# Patient Record
Sex: Female | Born: 1985 | Race: Black or African American | Hispanic: No | Marital: Single | Smoking: Never smoker
Health system: Southern US, Community
[De-identification: ages and names within clinical notes are randomized; demographics above are authoritative.]

## PROBLEM LIST (undated history)

## (undated) ENCOUNTER — Inpatient Hospital Stay (HOSPITAL_COMMUNITY): Payer: Self-pay

## (undated) ENCOUNTER — Ambulatory Visit (HOSPITAL_COMMUNITY): Admission: EM | Payer: Commercial Managed Care - PPO | Source: Home / Self Care

## (undated) DIAGNOSIS — I10 Essential (primary) hypertension: Secondary | ICD-10-CM

## (undated) DIAGNOSIS — R87629 Unspecified abnormal cytological findings in specimens from vagina: Secondary | ICD-10-CM

## (undated) DIAGNOSIS — E119 Type 2 diabetes mellitus without complications: Secondary | ICD-10-CM

## (undated) HISTORY — DX: Essential (primary) hypertension: I10

## (undated) HISTORY — PX: CHOLECYSTECTOMY: SHX55

---

## 2014-09-22 ENCOUNTER — Encounter (HOSPITAL_COMMUNITY): Payer: Self-pay | Admitting: *Deleted

## 2014-09-22 ENCOUNTER — Inpatient Hospital Stay (HOSPITAL_COMMUNITY)
Admission: AD | Admit: 2014-09-22 | Discharge: 2014-09-22 | Disposition: A | Discharge status: Home / Self Care | Payer: No Typology Code available for payment source | Source: Ambulatory Visit | Attending: Family Medicine | Admitting: Family Medicine

## 2014-09-22 DIAGNOSIS — R102 Pelvic and perineal pain: Secondary | ICD-10-CM | POA: Insufficient documentation

## 2014-09-22 DIAGNOSIS — N898 Other specified noninflammatory disorders of vagina: Secondary | ICD-10-CM

## 2014-09-22 HISTORY — DX: Unspecified abnormal cytological findings in specimens from vagina: R87.629

## 2014-09-22 LAB — URINALYSIS, ROUTINE W REFLEX MICROSCOPIC
BILIRUBIN URINE: NEGATIVE
GLUCOSE, UA: NEGATIVE mg/dL
Ketones, ur: NEGATIVE mg/dL
Leukocytes, UA: NEGATIVE
Nitrite: NEGATIVE
PROTEIN: NEGATIVE mg/dL
SPECIFIC GRAVITY, URINE: 1.02 (ref 1.005–1.030)
Urobilinogen, UA: 0.2 mg/dL (ref 0.0–1.0)
pH: 6 (ref 5.0–8.0)

## 2014-09-22 LAB — WET PREP, GENITAL
Clue Cells Wet Prep HPF POC: NONE SEEN
Trich, Wet Prep: NONE SEEN
YEAST WET PREP: NONE SEEN

## 2014-09-22 LAB — POCT PREGNANCY, URINE: Preg Test, Ur: NEGATIVE

## 2014-09-22 LAB — URINE MICROSCOPIC-ADD ON

## 2014-09-22 NOTE — MAU Note (Signed)
Pelvic pain that radiates to her back for the past 2 years.  Pain started 1 1/2 weeks ago with this episode, pt started her period today.

## 2014-09-22 NOTE — Discharge Instructions (Signed)
Exercise Safely Exercise does not have to hurt to be beneficial. Start easy and build up your effort. Exercise at times of the day when you feel your best. Do not hold your breath; remember to breathe out while working and breathe in while relaxing. May also count out loud while exercising. Exercise should not cause joint pain, but rather muscle fatigue.  Copyright  VHI. All rights reserved.

## 2014-09-22 NOTE — MAU Provider Note (Signed)
  History     CSN: [id number]  Arrival date and time: 09/22/14 1724   First Provider Initiated Contact with Patient 09/22/14 1806      Chief Complaint  Patient presents with  . Pelvic Pain   HPI Meredith Harrington 29 y.o. G0P0000 nonpregnant female presents to MAU complaining of vaginal discharge and pelvic pain.  She reports this has happened off and on for 2+ years.  She does not trust the medical system and has not sought eval for this in some time.  However, she did go one time and was found to have BV.  Treatment resulted in resolution of symptoms.   She presently denies nausea, vomiting, fever, weakness, vaginal bleeding, dysuria.    OB History    Gravida Para Term Preterm AB TAB SAB Ectopic Multiple Living   0 0 0 0 0 0 0 0 0 0       Past Medical History  Diagnosis Date  . Vaginal Pap smear, abnormal     Past Surgical History  Procedure Laterality Date  . Cholecystectomy      History reviewed. No pertinent family history.  History  Substance Use Topics  . Smoking status: Never Smoker   . Smokeless tobacco: Not on file  . Alcohol Use: Yes     Comment: ocasional on holidays    Allergies: No Known Allergies  Prescriptions prior to admission  Medication Sig Dispense Refill Last Dose  . Multiple Vitamins-Minerals (MULTIVITAMIN & MINERAL) LIQD Take 15 mLs by mouth daily.   Past Month at Unknown time    ROS Pertinent ROS in HPI.  All other systems are negative.   Physical Exam   Blood pressure 144/88, pulse 96, temperature 98.6 F (37 C), temperature source Oral, resp. rate 18, height 5\' 6"  (1.676 m), weight 238 lb (107.956 kg), last menstrual period 09/22/2014.  Physical Exam  Constitutional: She is oriented to person, place, and time. She appears well-developed and well-nourished. No distress.  HENT:  Head: Normocephalic and atraumatic.  Eyes: EOM are normal.  Neck: Normal range of motion.  Cardiovascular: Normal rate and regular rhythm.   Respiratory:  Effort normal. No respiratory distress.  GI: Soft. She exhibits no distension. There is no tenderness.  Genitourinary:  Small amt of vaginal discharge No CMT, no adnexal mass or tenderness  Musculoskeletal: Normal range of motion.  Neurological: She is alert and oriented to person, place, and time.  Skin: Skin is warm and dry.  Psychiatric: She has a normal mood and affect.    MAU Course  Procedures  MDM STD testing, CBC ordered.  Pt declines blood draw.   Wet prep is noncontributory Pt declines further eval.    Assessment and Plan  A: vaginal discharge  P: Discharge to home Awaiting GC/Chlam F/u with PCP vs Health Department for further eval  Bertram Denvereague Clark, Karen E 09/22/2014, 7:29 PM

## 2014-09-26 LAB — GC/CHLAMYDIA PROBE AMP (~~LOC~~) NOT AT ARMC
Chlamydia: NEGATIVE
Neisseria Gonorrhea: NEGATIVE

## 2015-10-08 ENCOUNTER — Encounter (HOSPITAL_COMMUNITY): Payer: Self-pay

## 2015-10-08 ENCOUNTER — Emergency Department (HOSPITAL_COMMUNITY)
Admission: EM | Admit: 2015-10-08 | Discharge: 2015-10-08 | Disposition: A | Discharge status: Home / Self Care | Payer: No Typology Code available for payment source | Attending: Emergency Medicine | Admitting: Emergency Medicine

## 2015-10-08 DIAGNOSIS — E119 Type 2 diabetes mellitus without complications: Secondary | ICD-10-CM | POA: Diagnosis not present

## 2015-10-08 DIAGNOSIS — Z7984 Long term (current) use of oral hypoglycemic drugs: Secondary | ICD-10-CM | POA: Insufficient documentation

## 2015-10-08 DIAGNOSIS — R42 Dizziness and giddiness: Secondary | ICD-10-CM | POA: Diagnosis present

## 2015-10-08 DIAGNOSIS — R0981 Nasal congestion: Secondary | ICD-10-CM

## 2015-10-08 DIAGNOSIS — R519 Headache, unspecified: Secondary | ICD-10-CM

## 2015-10-08 DIAGNOSIS — R51 Headache: Secondary | ICD-10-CM

## 2015-10-08 LAB — BASIC METABOLIC PANEL
Anion gap: 6 (ref 5–15)
CALCIUM: 9 mg/dL (ref 8.9–10.3)
CO2: 26 mmol/L (ref 22–32)
Chloride: 104 mmol/L (ref 101–111)
Creatinine, Ser: 0.77 mg/dL (ref 0.44–1.00)
GFR calc Af Amer: >60 mL/min (ref >60)
GFR calc non Af Amer: >60 mL/min (ref >60)
Glucose, Bld: 152 mg/dL — ABNORMAL HIGH (ref 65–99)
Potassium: 4.1 mmol/L (ref 3.5–5.1)
Sodium: 136 mmol/L (ref 135–145)

## 2015-10-08 LAB — CBC
HCT: 34.7 % — ABNORMAL LOW (ref 36.0–46.0)
Hemoglobin: 12 g/dL (ref 12.0–15.0)
MCH: 26.7 pg (ref 26.0–34.0)
MCHC: 34.6 g/dL (ref 30.0–36.0)
MCV: 77.1 fL — ABNORMAL LOW (ref 78.0–100.0)
Platelets: 273 10*3/uL (ref 150–400)
RBC: 4.5 MIL/uL (ref 3.87–5.11)
RDW: 14.6 % (ref 11.5–15.5)
WBC: 8.3 10*3/uL (ref 4.0–10.5)

## 2015-10-08 LAB — URINE MICROSCOPIC-ADD ON: Bacteria, UA: NONE SEEN

## 2015-10-08 LAB — CBG MONITORING, ED
GLUCOSE-CAPILLARY: 151 mg/dL — AB (ref 65–99)
Glucose-Capillary: 143 mg/dL — ABNORMAL HIGH (ref 65–99)

## 2015-10-08 LAB — URINALYSIS, ROUTINE W REFLEX MICROSCOPIC
BILIRUBIN URINE: NEGATIVE
Glucose, UA: NEGATIVE mg/dL
HGB URINE DIPSTICK: NEGATIVE
Ketones, ur: NEGATIVE mg/dL
Nitrite: NEGATIVE
Protein, ur: NEGATIVE mg/dL
Specific Gravity, Urine: 1.022 (ref 1.005–1.030)
pH: 6 (ref 5.0–8.0)

## 2015-10-08 LAB — POC URINE PREG, ED: Preg Test, Ur: NEGATIVE

## 2015-10-08 MED ORDER — IBUPROFEN 200 MG PO TABS
600.0000 mg | ORAL_TABLET | Freq: Once | ORAL | Status: AC
  Administered 2015-10-08: 600 mg via ORAL
  Filled 2015-10-08: qty 3

## 2015-10-08 MED ORDER — MECLIZINE HCL 25 MG PO TABS: 25.0000 mg | ORAL_TABLET | Freq: Once | ORAL | Status: DC

## 2015-10-08 MED ORDER — METFORMIN HCL 500 MG PO TABS: 500.0000 mg | ORAL_TABLET | Freq: Two times a day (BID) | ORAL | Status: DC

## 2015-10-08 MED ORDER — MECLIZINE HCL 25 MG PO TABS
25.0000 mg | ORAL_TABLET | Freq: Once | ORAL | Status: AC
  Administered 2015-10-08: 25 mg via ORAL
  Filled 2015-10-08: qty 1

## 2015-10-08 MED ORDER — ONDANSETRON HCL 4 MG PO TABS: 4.0000 mg | ORAL_TABLET | Freq: Four times a day (QID) | ORAL | Status: DC

## 2015-10-08 NOTE — Discharge Instructions (Signed)
Take the Antivert as prescribed. Follow-up with your primary care provider within 2 days to be reevaluated for you dizziness.   Take Flonase and Zyrtec once daily.  Return to the emergency department if you experience worsening dizziness, you pass out, visual changes, nausea, vomiting, numbnes/tingling or weakness.  Allergies An allergy is an abnormal reaction to a substance by the body's defense system (immune system). Allergies can develop at any age. WHAT CAUSES ALLERGIES? An allergic reaction happens when the immune system mistakenly reacts to a normally harmless substance, called an allergen, as if it were harmful. The immune system releases antibodies to fight the substance. Antibodies eventually release a chemical called histamine into the bloodstream. The release of histamine is meant to protect the body from infection, but it also causes discomfort. An allergic reaction can be triggered by:  Eating an allergen.  Inhaling an allergen.  Touching an allergen. WHAT TYPES OF ALLERGIES ARE THERE? There are many types of allergies. Common types include:  Seasonal allergies. People with this type of allergy are usually allergic to substances that are only present during certain seasons, such as molds and pollens.  Food allergies.  Drug allergies.  Insect allergies.  Animal dander allergies. WHAT ARE SYMPTOMS OF ALLERGIES? Possible allergy symptoms include:  Swelling of the lips, face, tongue, mouth, or throat.  Sneezing, coughing, or wheezing.  Nasal congestion.  Tingling in the mouth.  Rash.  Itching.  Itchy, red, swollen areas of skin (hives).  Watery eyes.  Vomiting.  Diarrhea.  Dizziness.  Lightheadedness.  Fainting.  Trouble breathing or swallowing.  Chest tightness.  Rapid heartbeat. HOW ARE ALLERGIES DIAGNOSED? Allergies are diagnosed with a medical and family history and one or more of the following:  Skin tests.  Blood tests.  A food  diary. A food diary is a record of all the foods and drinks you have in a day and of all the symptoms you experience.  The results of an elimination diet. An elimination diet involves eliminating foods from your diet and then adding them back in one by one to find out if a certain food causes an allergic reaction. HOW ARE ALLERGIES TREATED? There is no cure for allergies, but allergic reactions can be treated with medicine. Severe reactions usually need to be treated at a hospital. HOW CAN REACTIONS BE PREVENTED? The best way to prevent an allergic reaction is by avoiding the substance you are allergic to. Allergy shots and medicines can also help prevent reactions in some cases. People with severe allergic reactions may be able to prevent a life-threatening reaction called anaphylaxis with a medicine given right after exposure to the allergen.   This information is not intended to replace advice given to you by your health care provider. Make sure you discuss any questions you have with your health care provider.   Document Released: 07/08/2002 Document Revised: 05/05/2014 Document Reviewed: 01/24/2014 Elsevier Interactive Patient Education 2016 Else[employer] Inc.  Benign Positional Vertigo Vertigo is the feeling that you or your surroundings are moving when they are not. Benign positional vertigo is the most common form of vertigo. The cause of this condition is not serious (is benign). This condition is triggered by certain movements and positions (is positional). This condition can be dangerous if it occurs while you are doing something that could endanger you or others, such as driving.  CAUSES In many cases, the cause of this condition is not known. It may be caused by a disturbance in an area of the  inner ear that helps your brain to sense movement and balance. This disturbance can be caused by a viral infection (labyrinthitis), head injury, or repetitive motion. RISK FACTORS This condition is  more likely to develop in:  Women.  People who are 48 years of age or older. SYMPTOMS Symptoms of this condition usually happen when you move your head or your eyes in different directions. Symptoms may start suddenly, and they usually last for less than a minute. Symptoms may include:  Loss of balance and falling.  Feeling like you are spinning or moving.  Feeling like your surroundings are spinning or moving.  Nausea and vomiting.  Blurred vision.  Dizziness.  Involuntary eye movement (nystagmus). Symptoms can be mild and cause only slight annoyance, or they can be severe and interfere with daily life. Episodes of benign positional vertigo may return (recur) over time, and they may be triggered by certain movements. Symptoms may improve over time. DIAGNOSIS This condition is usually diagnosed by medical history and a physical exam of the head, neck, and ears. You may be referred to a health care provider who specializes in ear, nose, and throat (ENT) problems (otolaryngologist) or a provider who specializes in disorders of the nervous system (neurologist). You may have additional testing, including:  MRI.  A CT scan.  Eye movement tests. Your health care provider may ask you to change positions quickly while he or she watches you for symptoms of benign positional vertigo, such as nystagmus. Eye movement may be tested with an electronystagmogram (ENG), caloric stimulation, the Dix-Hallpike test, or the roll test.  An electroencephalogram (EEG). This records electrical activity in your brain.  Hearing tests. TREATMENT Usually, your health care provider will treat this by moving your head in specific positions to adjust your inner ear back to normal. Surgery may be needed in severe cases, but this is rare. In some cases, benign positional vertigo may resolve on its own in 2-4 weeks. HOME CARE INSTRUCTIONS Safety  Move slowly.Avoid sudden body or head movements.  Avoid  driving.  Avoid operating heavy machinery.  Avoid doing any tasks that would be dangerous to you or others if a vertigo episode would occur.  If you have trouble walking or keeping your balance, try using a cane for stability. If you feel dizzy or unstable, sit down right away.  Return to your normal activities as told by your health care provider. Ask your health care provider what activities are safe for you. General Instructions  Take over-the-counter and prescription medicines only as told by your health care provider.  Avoid certain positions or movements as told by your health care provider.  Drink enough fluid to keep your urine clear or pale yellow.  Keep all follow-up visits as told by your health care provider. This is important. SEEK MEDICAL CARE IF:  You have a fever.  Your condition gets worse or you develop new symptoms.  Your family or friends notice any behavioral changes.  Your nausea or vomiting gets worse.  You have numbness or a "pins and needles" sensation. SEEK IMMEDIATE MEDICAL CARE IF:  You have difficulty speaking or moving.  You are always dizzy.  You faint.  You develop severe headaches.  You have weakness in your legs or arms.  You have changes in your hearing or vision.  You develop a stiff neck.  You develop sensitivity to light.   This information is not intended to replace advice given to you by your health care provider. Make  sure you discuss any questions you have with your health care provider.   Document Released: 01/20/2006 Document Revised: 01/03/2015 Document Reviewed: 08/07/2014 Elsevier Interactive Patient Education [employer]2016 Else[employer] Inc.

## 2015-10-08 NOTE — ED Notes (Signed)
Pt is ambulatory and reassessed

## 2015-10-08 NOTE — ED Notes (Addendum)
Pt CBG, 151. 

## 2015-10-08 NOTE — ED Provider Notes (Signed)
CSN: [id number]     Arrival date & time 10/08/15  0945 History   First MD Initiated Contact with Patient 10/08/15 1551     Chief Complaint  Patient presents with  . Dizziness     (Consider location/radiation/quality/duration/timing/severity/associated sxs/prior Treatment) HPI   Patient is a 30 year old female with a history of untreated DM type II who presents the ED with 3 months of intermittent dizziness. She states the dizziness occurs while driving her car, while moving at work, and rising from a seated position or from lying. She states the dizziness is not a sensation of the room spinning but as if her head is disconnected. She states associated sinus congestion, sinus headache, neck tightness, post nasal drainage, ear pressure. She has not taken anything for the dizziness. She denies syncope, chest pain, shortness of breath, numbness/tingling, weakness, fever, chills, visual changes.  Past Medical History  Diagnosis Date  . Vaginal Pap smear, abnormal    Past Surgical History  Procedure Laterality Date  . Cholecystectomy     No family history on file. Social History  Substance Use Topics  . Smoking status: Never Smoker   . Smokeless tobacco: None  . Alcohol Use: Yes     Comment: ocasional on holidays   OB History    Gravida Para Term Preterm AB TAB SAB Ectopic Multiple Living   0 0 0 0 0 0 0 0 0 0      Review of Systems  Constitutional: Negative for fever and chills.  HENT: Positive for congestion, ear pain, postnasal drip and sinus pressure. Negative for ear discharge, sore throat and tinnitus.   Eyes: Negative for visual disturbance.  Respiratory: Negative for shortness of breath.   Cardiovascular: Negative for chest pain.  Gastrointestinal: Negative for nausea, vomiting and abdominal pain.  Musculoskeletal: Positive for neck stiffness. Negative for back pain and neck pain.  Skin: Negative for rash.  Neurological: Positive for dizziness and headaches. Negative for  syncope, weakness and numbness.      Allergies  Shellfish allergy  Home Medications   Prior to Admission medications   Medication Sig Start Date End Date Taking? Authorizing Provider  meclizine (ANTIVERT) 25 MG tablet Take 1 tablet (25 mg total) by mouth once. 10/08/15   Jerre Simon, PA  metFORMIN (GLUCOPHAGE) 500 MG tablet Take 1 tablet (500 mg total) by mouth 2 (two) times daily with a meal. 10/08/15   Jerre Simon, PA  Multiple Vitamins-Minerals (MULTIVITAMIN & MINERAL) LIQD Take 15 mLs by mouth daily.    Historical Provider, MD  ondansetron (ZOFRAN) 4 MG tablet Take 1 tablet (4 mg total) by mouth every 6 (six) hours. 10/08/15   Paiton Fosco L Midge Momon, PA   BP 140/88 mmHg  Pulse 64  Temp(Src) 98.2 F (36.8 C) (Oral)  Resp 18  Ht 5\' 6"  (1.676 m)  Wt 117.935 kg  BMI 41.99 kg/m2  SpO2 100% Physical Exam   Constitutional: Pt is oriented to person, place, and time. Pt appears well-developed and well-nourished. No distress.  HENT:  Head: Normocephalic and atraumatic.  Mouth/Throat: Oropharynx is clear and moist.  Eyes: Conjunctivae and EOM are normal. Pupils are equal, round, and reactive to light. No scleral icterus.  Ears: Tm's normal, fluid appreciated behind TM's, no erythema or exudate apprecitated No horizontal, vertical or rotational nystagmus, dizziness reproduced with right and left gaze   Neck: Normal range of motion. Neck supple.  Full active and passive ROM without pain No nuchal rigidity or meningeal signs  Cardiovascular:  Normal rate, regular rhythm and intact distal pulses, DP 2+ bilaterally.   Pulmonary/Chest: Effort normal  No respiratory distress, CTA, no rhonchi, wheezes or rales.  Musculoskeletal: Normal range of motion.  Neurological: Pt. is alert and oriented to person, place, and time. No cranial nerve deficit.  Exhibits normal muscle tone. Coordination normal.  Mental Status:  Alert, oriented, thought content appropriate. Speech fluent without evidence of  aphasia. Able to follow 2 step commands without difficulty.  Cranial Nerves:  II:  Peripheral visual fields grossly normal, pupils equal, round, reactive to light III,IV, VI: ptosis not present, extra-ocular motions intact bilaterally  V,VII: smile symmetric, facial light touch sensation equal VIII: hearing grossly normal bilaterally  IX,X: midline uvula rise  XI: bilateral shoulder shrug equal and strong XII: midline tongue extension  Motor:  5/5 in upper and lower extremities bilaterally including strong and equal grip strength and dorsiflexion/plantar flexion Sensory: light touch normal in all extremities.  Cerebellar: normal finger-to-nose with bilateral upper extremities, pronator drift negative Gait: normal gait and balance Skin: Skin is warm and dry. No rash noted. Pt is not diaphoretic.  Psychiatric: Pt has a normal mood and affect. Behavior is normal. Judgment and thought content normal.  Nursing note and vitals reviewed.   ED Course  Procedures (including critical care time) Labs Review Labs Reviewed  BASIC METABOLIC PANEL - Abnormal; Notable for the following:    Glucose, Bld 152 (*)    BUN <5 (*)    All other components within normal limits  CBC - Abnormal; Notable for the following:    HCT 34.7 (*)    MCV 77.1 (*)    All other components within normal limits  URINALYSIS, ROUTINE W REFLEX MICROSCOPIC (NOT [facility]) - Abnormal; Notable for the following:    APPearance HAZY (*)    Leukocytes, UA SMALL (*)    All other components within normal limits  URINE MICROSCOPIC-ADD ON - Abnormal; Notable for the following:    Squamous Epithelial / LPF 6-30 (*)    All other components within normal limits  CBG MONITORING, ED - Abnormal; Notable for the following:    Glucose-Capillary 151 (*)    All other components within normal limits  CBG MONITORING, ED - Abnormal; Notable for the following:    Glucose-Capillary 143 (*)    All other components within normal limits  POC  URINE PREG, ED    Imaging Review No results found. I have personally reviewed and evaluated these images and lab results as part of my medical decision-making.   EKG Interpretation   Date/Time:  Monday October 08 2015 10:45:00 EDT Ventricular Rate:  65 PR Interval:  148 QRS Duration: 82 QT Interval:  376 QTC Calculation: 391 R Axis:   50 Text Interpretation:  Normal sinus rhythm Normal ECG Confirmed by Lincoln Brighamees,  Liz [phone]([id number]) on 10/08/2015 3:42:39 PM      MDM   Final diagnoses:  Dizziness  Nonintractable headache, unspecified chronicity pattern, unspecified headache type  Sinus congestion   Afebrile well-appearing patient, VSS. Labs unremarkable. Patient with intermittent dizziness for roughly 3 months. Patient states history of sinus congestion and she feels the dizziness came on with the onset of this sinus congestion. Patient has taken a nasal decongestant spray without relief. Patient was no neurological deficits on exam. Her dizziness reproduced with gaze to the right and gaze to the left during EOM exam. No nystagmus appreciated. I feel that her dizziness is likely secondary to sinus congestion and increased inner ear  pressure. Instructed patient to take Flonase and Zyrtec. Patient was given an Antivert while in the ED and her dizziness improved. Less likely this is intracranial etiology and more likely benign positional vertigo. Patient not orthostatic in the ED. EKG revealed normal sinus rhythm.  Patient's blood sugar was elevated while in the ED. She states she was given metformin months ago but did not take it because it made her nauseous. I gave her prescription for metformin and Zofran and instructed her to take her metformin and follow up with her PCP. Also patient's blood pressure was slightly elevated. I instructed her to follow-up with her PCP regarding this as well.  I gave patient a prescription for Antivert at time of discharge. I discussed strict return precautions. I  instructed the patient to follow up with her PCP regarding these issues. She expressed understanding to the discharge instructions.    Jerre Simon, PA 10/09/15 1610  Tilden Fossa, MD 10/09/15 1547

## 2015-10-08 NOTE — ED Notes (Signed)
Patient comes to the ER today complaining of dizzy spells for last 3 months. SHe comes in today because she had a dizzy spell and work sent her here. She was driven by co-worker.

## 2015-10-13 ENCOUNTER — Encounter (HOSPITAL_COMMUNITY): Payer: Self-pay

## 2015-10-13 ENCOUNTER — Emergency Department (HOSPITAL_COMMUNITY)
Admission: EM | Admit: 2015-10-13 | Discharge: 2015-10-13 | Disposition: A | Discharge status: Home / Self Care | Payer: No Typology Code available for payment source | Attending: Emergency Medicine | Admitting: Emergency Medicine

## 2015-10-13 DIAGNOSIS — G44209 Tension-type headache, unspecified, not intractable: Secondary | ICD-10-CM

## 2015-10-13 DIAGNOSIS — M542 Cervicalgia: Secondary | ICD-10-CM | POA: Diagnosis present

## 2015-10-13 MED ORDER — DIPHENHYDRAMINE HCL 25 MG PO CAPS
50.0000 mg | ORAL_CAPSULE | Freq: Once | ORAL | Status: DC
  Filled 2015-10-13: qty 2

## 2015-10-13 MED ORDER — METOCLOPRAMIDE HCL 10 MG PO TABS
10.0000 mg | ORAL_TABLET | Freq: Once | ORAL | Status: AC
  Administered 2015-10-13: 10 mg via ORAL
  Filled 2015-10-13: qty 1

## 2015-10-13 MED ORDER — DIPHENHYDRAMINE HCL 25 MG PO CAPS
25.0000 mg | ORAL_CAPSULE | Freq: Once | ORAL | Status: AC
  Administered 2015-10-13: 25 mg via ORAL

## 2015-10-13 MED ORDER — KETOROLAC TROMETHAMINE 30 MG/ML IJ SOLN
30.0000 mg | Freq: Once | INTRAMUSCULAR | Status: AC
  Administered 2015-10-13: 30 mg via INTRAMUSCULAR
  Filled 2015-10-13: qty 1

## 2015-10-13 NOTE — Discharge Instructions (Signed)
Please follow-up with your doctor or the [facility] for further evaluation and management of your symptoms as well as management of your blood pressure. Return to ED for new or worsening symptoms as we discussed.  Tension Headache A tension headache is a feeling of pain, pressure, or aching that is often felt over the front and sides of the head. The pain can be dull, or it can feel tight (constricting). Tension headaches are not normally associated with nausea or vomiting, and they do not get worse with physical activity. Tension headaches can last from 30 minutes to several days. This is the most common type of headache. CAUSES The exact cause of this condition is not known. Tension headaches often begin after stress, anxiety, or depression. Other triggers may include:  Alcohol.  Too much caffeine, or caffeine withdrawal.  Respiratory infections, such as colds, flu, or sinus infections.  Dental problems or teeth clenching.  Fatigue.  Holding your head and neck in the same position for a long period of time, such as while using a computer.  Smoking. SYMPTOMS Symptoms of this condition include:  A feeling of pressure around the head.  Dull, aching head pain.  Pain felt over the front and sides of the head.  Tenderness in the muscles of the head, neck, and shoulders. DIAGNOSIS This condition may be diagnosed based on your symptoms and a physical exam. Tests may be done, such as a CT scan or an MRI of your head. These tests may be done if your symptoms are severe or unusual. TREATMENT This condition may be treated with lifestyle changes and medicines to help relieve symptoms. HOME CARE INSTRUCTIONS Managing Pain  Take over-the-counter and prescription medicines only as told by your health care provider.  Lie down in a dark, quiet room when you have a headache.  If directed, apply ice to the head and neck area:  Put ice in a plastic bag.  Place a  towel between your skin and the bag.  Leave the ice on for 20 minutes, 2-3 times per day.  Use a heating pad or a hot shower to apply heat to the head and neck area as told by your health care provider. Eating and Drinking  Eat meals on a regular schedule.  Limit alcohol use.  Decrease your caffeine intake, or stop using caffeine. General Instructions  Keep all follow-up visits as told by your health care provider. This is important.  Keep a headache journal to help find out what may trigger your headaches. For example, write down:  What you eat and drink.  How much sleep you get.  Any change to your diet or medicines.  Try massage or other relaxation techniques.  Limit stress.  Sit up straight, and avoid tensing your muscles.  Do not use tobacco products, including cigarettes, chewing tobacco, or e-cigarettes. If you need help quitting, ask your health care provider.  Exercise regularly as told by your health care provider.  Get 7-9 hours of sleep, or the amount recommended by your health care provider. SEEK MEDICAL CARE IF:  Your symptoms are not helped by medicine.  You have a headache that is different from what you normally experience.  You have nausea or you vomit.  You have a fever. SEEK IMMEDIATE MEDICAL CARE IF:  Your headache becomes severe.  You have repeated vomiting.  You have a stiff neck.  You have a loss of vision.  You have problems with speech.  You have  pain in your eye or ear.  You have muscular weakness or loss of muscle control.  You lose your balance or you have trouble walking.  You feel faint or you pass out.  You have confusion.   This information is not intended to replace advice given to you by your health care provider. Make sure you discuss any questions you have with your health care provider.   Document Released: 04/14/2005 Document Revised: 01/03/2015 Document Reviewed: 08/07/2014 Elsevier Interactive Patient  Education [employer]

## 2015-10-13 NOTE — ED Provider Notes (Signed)
CSN: [id number]     Arrival date & time 10/13/15  1542 History  By signing my name below, I, Linna Darner, attest that this documentation has been prepared under the direction and in the presence of [employer], PA-C. Electronically Signed: Linna Darner, Scribe. 10/13/2015. 4:29 PM.   Chief Complaint  Patient presents with  . Headache    The history is provided by the patient. No language interpreter was used.    HPI Comments: Meredith Harrington is a 30 y.o. female with h/o untreated DM type II who presents to the Emergency Department complaining of sudden onset, constant, severe, generalized headache for the last several days. Pt endorses associated bilateral neck and shoulder pain, sensation of tightness in her head, and photophobia as well. Pt notes that she was seen at [facility] 5 days ago for intermittent dizziness and headaches. She has been taking her medication for HTN since that visit and notes that her blood pressure has been elevated over the last few days; she measured it at 157/112 this morning. Pt states that she does not drink coffee or alcohol and has not been drinking soda recently. She denies known sick contacts. She further denies vision changes, nausea, vomiting, neuro deficits, fever, or any other associated symptoms.   Past Medical History  Diagnosis Date  . Vaginal Pap smear, abnormal    Past Surgical History  Procedure Laterality Date  . Cholecystectomy     No family history on file. Social History  Substance Use Topics  . Smoking status: Never Smoker   . Smokeless tobacco: None  . Alcohol Use: Yes     Comment: ocasional on holidays   OB History    Gravida Para Term Preterm AB TAB SAB Ectopic Multiple Living       Review of Systems  A complete 10 system review of systems was obtained and all systems are negative except as noted in the HPI and PMH.   Allergies  Shellfish allergy  Home Medications   Prior to Admission  medications   Medication Sig Start Date End Date Taking? Authorizing Provider  meclizine (ANTIVERT) 25 MG tablet Take 1 tablet (25 mg total) by mouth once. 10/08/15   Jerre Simon, PA  metFORMIN (GLUCOPHAGE) 500 MG tablet Take 1 tablet (500 mg total) by mouth 2 (two) times daily with a meal. 10/08/15   Jerre Simon, PA  Multiple Vitamins-Minerals (MULTIVITAMIN & MINERAL) LIQD Take 15 mLs by mouth daily.    Historical Provider, MD  ondansetron (ZOFRAN) 4 MG tablet Take 1 tablet (4 mg total) by mouth every 6 (six) hours. 10/08/15   Jessica L Focht, PA   BP 140/98 mmHg  Temp(Src) 98.9 F (37.2 C) (Oral)  Resp 18  SpO2 99%  LMP 09/16/2015 (Exact Date) Physical Exam  Constitutional: She is oriented to person, place, and time. She appears well-developed and well-nourished. No distress.  HENT:  Head: Normocephalic and atraumatic.  Mouth/Throat: Oropharynx is clear and moist.  Eyes: Conjunctivae and EOM are normal. Pupils are equal, round, and reactive to light.  Neck: Normal range of motion. Neck supple. No tracheal deviation present.  No meningismus or nuchal rigidity  Cardiovascular: Normal rate, regular rhythm and normal heart sounds.   Pulmonary/Chest: Effort normal and breath sounds normal. No respiratory distress.  Abdominal: Soft. There is no tenderness.  Musculoskeletal: Normal range of motion. She exhibits no edema or tenderness.  Neurological: She is alert and oriented  to person, place, and time.  Cranial nerves III through XII grossly intact. Motor strength is 5/5 in all 4 extremities and sensation is intact to light touch. Completes finger to nose coordination movements without difficulty. No nystagmus. No pronator drift. Gait is baseline without ataxia.  Skin: Skin is warm and dry. She is not diaphoretic.  Psychiatric: She has a normal mood and affect. Her behavior is normal.  Nursing note and vitals reviewed.   ED Course  Procedures (including critical care  time)  DIAGNOSTIC STUDIES: Oxygen Saturation is 99% on RA, normal by my interpretation.    COORDINATION OF CARE: 4:29 PM Discussed treatment plan with pt at bedside and pt agreed to plan.  Labs Review Labs Reviewed - No data to display  Imaging Review No results found. I have personally reviewed and evaluated these images and lab results as part of my medical decision-making.   EKG Interpretation None      MDM  Pt HA treated and improved while in ED.  Presentation is like pts typical HA and non concerning [facility], Meningitis, or temporal arteritis. Pt is afebrile with no focal neuro deficits, nuchal rigidity, or change in vision. Pt is to follow up with PCP to discuss prophylactic medication. Pt verbalizes understanding and is agreeable with plan to dc. Given referral to PCP for follow-up. Final diagnoses:  Tension-type headache, not intractable, unspecified chronicity pattern    I personally performed the services described in this documentation, which was scribed in my presence. The recorded information has been reviewed and is accurate.   Joycie PeekBenjamin Shaleen Talamantez, PA-C 10/13/15 1747  Gwyneth SproutWhitney Plunkett, MD 10/13/15 2151

## 2015-10-13 NOTE — ED Notes (Signed)
She c/o headache and theorizes it may be her b/p, however, we obtain a reading of 140/98.  She also states she feels as if she has some "sinus trouble".  She is alert and oriented x 4 and in no distress.

## 2016-05-29 DIAGNOSIS — I10 Essential (primary) hypertension: Secondary | ICD-10-CM

## 2016-05-29 HISTORY — DX: Essential (primary) hypertension: I10

## 2016-06-19 ENCOUNTER — Emergency Department (HOSPITAL_COMMUNITY): Payer: No Typology Code available for payment source

## 2016-06-19 ENCOUNTER — Emergency Department (HOSPITAL_COMMUNITY)
Admission: EM | Admit: 2016-06-19 | Discharge: 2016-06-20 | Disposition: A | Discharge status: Home / Self Care | Payer: No Typology Code available for payment source | Attending: Emergency Medicine | Admitting: Emergency Medicine

## 2016-06-19 ENCOUNTER — Encounter (HOSPITAL_COMMUNITY): Payer: Self-pay | Admitting: Emergency Medicine

## 2016-06-19 DIAGNOSIS — R079 Chest pain, unspecified: Secondary | ICD-10-CM

## 2016-06-19 DIAGNOSIS — R0789 Other chest pain: Secondary | ICD-10-CM | POA: Insufficient documentation

## 2016-06-19 LAB — COMPREHENSIVE METABOLIC PANEL
ALBUMIN: 3.8 g/dL (ref 3.5–5.0)
ALK PHOS: 75 U/L (ref 38–126)
ALT: 14 U/L (ref 14–54)
AST: 21 U/L (ref 15–41)
Anion gap: 9 (ref 5–15)
BUN: <5 mg/dL — ABNORMAL LOW (ref 6–20)
CALCIUM: 9.3 mg/dL (ref 8.9–10.3)
CHLORIDE: 104 mmol/L (ref 101–111)
CO2: 25 mmol/L (ref 22–32)
CREATININE: 0.79 mg/dL (ref 0.44–1.00)
GFR calc non Af Amer: >60 mL/min (ref >60)
GLUCOSE: 128 mg/dL — AB (ref 65–99)
Potassium: 3.8 mmol/L (ref 3.5–5.1)
SODIUM: 138 mmol/L (ref 135–145)
Total Bilirubin: 0.5 mg/dL (ref 0.3–1.2)
Total Protein: 7.6 g/dL (ref 6.5–8.1)

## 2016-06-19 LAB — CBC WITH DIFFERENTIAL/PLATELET
BASOS ABS: 0.1 10*3/uL (ref 0.0–0.1)
Basophils Relative: 1 %
EOS ABS: 0.3 10*3/uL (ref 0.0–0.7)
EOS PCT: 3 %
HCT: 35.6 % — ABNORMAL LOW (ref 36.0–46.0)
HEMOGLOBIN: 12.5 g/dL (ref 12.0–15.0)
LYMPHS ABS: 3.3 10*3/uL (ref 0.7–4.0)
Lymphocytes Relative: 32 %
MCH: 27.4 pg (ref 26.0–34.0)
MCHC: 35.1 g/dL (ref 30.0–36.0)
MCV: 77.9 fL — ABNORMAL LOW (ref 78.0–100.0)
Monocytes Absolute: 0.6 10*3/uL (ref 0.1–1.0)
Monocytes Relative: 6 %
NEUTROS PCT: 58 %
Neutro Abs: 6.1 10*3/uL (ref 1.7–7.7)
PLATELETS: 254 10*3/uL (ref 150–400)
RBC: 4.57 MIL/uL (ref 3.87–5.11)
RDW: 15.4 % (ref 11.5–15.5)
WBC: 10.3 10*3/uL (ref 4.0–10.5)

## 2016-06-19 LAB — I-STAT TROPONIN, ED: Troponin i, poc: 0 ng/mL (ref 0.00–0.08)

## 2016-06-19 MED ORDER — METOPROLOL TARTRATE 25 MG PO TABS: 25.0000 mg | ORAL_TABLET | Freq: Two times a day (BID) | ORAL | 0 refills | Status: DC

## 2016-06-19 NOTE — ED Triage Notes (Signed)
Pt c/o central and left chest pain off and on x's 2 weeks.  St's at times it radiates into neck.  Also c/o right arm pain

## 2016-06-19 NOTE — Discharge Instructions (Signed)
Call the listed number to schedule prompt follow-up at the cardiologist's office for further evaluation of your chest pain. Return to the ER if symptoms worsen or change. Take an aspirin a day until follow-up.

## 2016-06-19 NOTE — ED Provider Notes (Signed)
[facility]-EMERGENCY DEPT Provider Note   CSN: [id number] Arrival date & time: 06/19/16  2049  By signing my name below, I, Rosario Adie, attest that this documentation has been prepared under the direction and in the presence of Gilda Crease, MD. Electronically Signed: Rosario Adie, ED Scribe. 06/19/16. 11:19 PM.  History   Chief Complaint Chief Complaint  Patient presents with  . Chest Pain   The history is provided by the patient. No language interpreter was used.    HPI Comments: Meredith Harrington is a 31 y.o. female with no pertinent PMHx, who presents to the Emergency Department complaining of sudden onset, intermittent episodes of left-sided chest pain onset two weeks ago. Pt states that her episode of pain today prior to coming into the ED was more severe than her typical episodes, and lasted for twenty minutes at that time. Pt also reports that her right arm has had some paraesthesias and her bilaterally feet have been intermittently swelling today. She notes associated chest tightness over the right, upper chest and into the right-sided neck and diaphoresis during her episodes of pain. Pt notes that she is typically cleaning during the onset of her pain, and she notes that during her episodes of pain she will rest and her pain will resolve after 30 minutes each time. She additionally reports that her blood pressures have been running high at home, but she is non-medicated for this issue. Pt is a non-smoker. No h/o HLD. Pt has a maternal FHx of prior MI; and notes that her mother passed at age 59 d/t complications with CHF. Pt denies shortness of breath, nausea, vomiting, abdominal pain, or any other associated symptoms.    Past Medical History:  Diagnosis Date  . Vaginal Pap smear, abnormal    There are no active problems to display for this patient.  Past Surgical History:  Procedure Laterality Date  . CHOLECYSTECTOMY     OB History    Gravida Para Term  Preterm AB Living   0 0 0 0 0 0   SAB TAB Ectopic Multiple Live Births   0 0 0 0       Home Medications    Prior to Admission medications   Medication Sig Start Date End Date Taking? Authorizing Provider  meclizine (ANTIVERT) 25 MG tablet Take 1 tablet (25 mg total) by mouth once. 10/08/15   Jerre Simon, PA  metFORMIN (GLUCOPHAGE) 500 MG tablet Take 1 tablet (500 mg total) by mouth 2 (two) times daily with a meal. 10/08/15   Jerre Simon, PA  metoprolol (LOPRESSOR) 25 MG tablet Take 1 tablet (25 mg total) by mouth 2 (two) times daily. 06/19/16   Gilda Crease, MD  Multiple Vitamins-Minerals (MULTIVITAMIN & MINERAL) LIQD Take 15 mLs by mouth daily.    Historical Provider, MD  ondansetron (ZOFRAN) 4 MG tablet Take 1 tablet (4 mg total) by mouth every 6 (six) hours. 10/08/15   Jerre Simon, PA   Family History No family history on file.  Social History Social History  Substance Use Topics  . Smoking status: Never Smoker  . Smokeless tobacco: Never Used  . Alcohol use Yes     Comment: ocasional on holidays   Allergies   Shellfish allergy  Review of Systems Review of Systems  Constitutional: Positive for diaphoresis.  Respiratory: Positive for chest tightness. Negative for shortness of breath.   Cardiovascular: Positive for chest pain and leg swelling.  Gastrointestinal: Negative for abdominal pain, nausea  and vomiting.  All other systems reviewed and are negative.  Physical Exam Updated Vital Signs BP 136/85   Pulse 66   Temp 98.6 F (37 C) (Oral)   Resp 14   Ht 5\' 6"  (1.676 m)   Wt 240 lb (108.9 kg)   LMP 06/05/2016 (Exact Date)   SpO2 100%   BMI 38.74 kg/m   Physical Exam  Constitutional: She is oriented to person, place, and time. She appears well-developed and well-nourished. No distress.  HENT:  Head: Normocephalic and atraumatic.  Right Ear: Hearing normal.  Left Ear: Hearing normal.  Nose: Nose normal.  Mouth/Throat: Oropharynx is clear and  moist and mucous membranes are normal.  Eyes: Conjunctivae and EOM are normal. Pupils are equal, round, and reactive to light.  Neck: Normal range of motion. Neck supple.  Cardiovascular: Regular rhythm, S1 normal and S2 normal.  Exam reveals no gallop and no friction rub.   No murmur heard. Pulmonary/Chest: Effort normal and breath sounds normal. No respiratory distress. She exhibits no tenderness.  Abdominal: Soft. Normal appearance and bowel sounds are normal. There is no hepatosplenomegaly. There is no tenderness. There is no rebound, no guarding, no tenderness at McBurney's point and negative Murphy's sign. No hernia.  Musculoskeletal: Normal range of motion.  Neurological: She is alert and oriented to person, place, and time. She has normal strength. No cranial nerve deficit or sensory deficit. Coordination normal. GCS eye subscore is 4. GCS verbal subscore is 5. GCS motor subscore is 6.  Skin: Skin is warm, dry and intact. No rash noted. No cyanosis.  Psychiatric: She has a normal mood and affect. Her speech is normal and behavior is normal. Thought content normal.  Nursing note and vitals reviewed.  ED Treatments / Results  DIAGNOSTIC STUDIES: Oxygen Saturation is 100% on RA, normal by my interpretation.   COORDINATION OF CARE: 11:19 PM-Discussed next steps with pt. Pt verbalized understanding and is agreeable with the plan.   Labs (all labs ordered are listed, but only abnormal results are displayed) Labs Reviewed  CBC WITH DIFFERENTIAL/PLATELET - Abnormal; Notable for the following:       Result Value   HCT 35.6 (*)    MCV 77.9 (*)    All other components within normal limits  COMPREHENSIVE METABOLIC PANEL - Abnormal; Notable for the following:    Glucose, Bld 128 (*)    BUN <5 (*)    All other components within normal limits  I-STAT TROPOININ, ED   EKG  EKG Interpretation  Date/Time:  Thursday June 19 2016 20:59:06 EST Ventricular Rate:  73 PR  Interval:  148 QRS Duration: 78 QT Interval:  356 QTC Calculation: 392 R Axis:   71 Text Interpretation:  Normal sinus rhythm with sinus arrhythmia Normal ECG Confirmed by POLLINA  MD, CHRISTOPHER [phone]) on 06/19/2016 11:03:54 PM      Radiology Dg Chest 2 View  Result Date: 06/19/2016 CLINICAL DATA:  Right-sided chest pain EXAM: CHEST  2 VIEW COMPARISON:  None. FINDINGS: The heart size and mediastinal contours are within normal limits. Both lungs are clear. The visualized skeletal structures are unremarkable. Surgical clips in the right upper quadrant. IMPRESSION: No active cardiopulmonary disease. Electronically Signed   By: Jasmine Pang M.D.   On: 06/19/2016 21:32   Procedures Procedures  Medications Ordered in ED Medications - No data to display  Initial Impression / Assessment and Plan / ED Course  I have reviewed the triage vital signs and the nursing notes.  Pertinent labs & imaging results that were available during my care of the patient were reviewed by me and considered in my medical decision making (see chart for details).     Patient presents to the emergency department for evaluation of chest pain. Patient does not have any previous history of coronary artery disease. She does, however, have multiple cardiac risk factors. Patient has been experiencing intermittent pain for the last several weeks or more. She reports that usually happens when she is doing housework. It doesn't usually happen at rest. She reports that it never lasts more than 20 or 30 minutes, generally sits down and rests when the pain occurs and it does improve.  Patient's HEART has been calculated at a 3. Based on this she is in a low risk category and outpatient workup is appropriate. Patient does have some concerning features to her pain, however, and therefore was counseled that she absolutely needs to schedule prompt follow-up or return to the ER for a change in features.   HEART Score for Major  Cardiac Events from [email or web address][employer][email or web address]  on 06/19/2016 ** All calculations should be rechecked by clinician prior to use **  RESULT SUMMARY: 3 points Low Score (0-3 points)  Risk of MACE of 0.9-1.7%.   INPUTS: History -> 1 = Moderately suspicious EKG -> 0 = Normal Age -> 0 = <45 Risk factors -> 2 = =3 risk factors or history of atherosclerotic disease Initial troponin -> 0 = =normal limit   Final Clinical Impressions(s) / ED Diagnoses   Final diagnoses:  Chest pain, unspecified type   New Prescriptions New Prescriptions   METOPROLOL (LOPRESSOR) 25 MG TABLET    Take 1 tablet (25 mg total) by mouth 2 (two) times daily.   I personally performed the services described in this documentation, which was scribed in my presence. The recorded information has been reviewed and is accurate.    Gilda Creasehristopher J Pollina, MD 06/19/16 [phone]

## 2016-10-21 ENCOUNTER — Encounter: Payer: Self-pay | Admitting: *Deleted

## 2016-10-21 ENCOUNTER — Other Ambulatory Visit (HOSPITAL_COMMUNITY)
Admission: RE | Admit: 2016-10-21 | Discharge: 2016-10-21 | Disposition: A | Discharge status: Home / Self Care | Payer: Medicaid Other | Source: Ambulatory Visit | Attending: Certified Nurse Midwife | Admitting: Certified Nurse Midwife

## 2016-10-21 ENCOUNTER — Ambulatory Visit (INDEPENDENT_AMBULATORY_CARE_PROVIDER_SITE_OTHER): Payer: Medicaid Other | Admitting: Certified Nurse Midwife

## 2016-10-21 ENCOUNTER — Encounter: Payer: Self-pay | Admitting: Certified Nurse Midwife

## 2016-10-21 VITALS — BP 137/86 | HR 88 | Wt 240.6 lb

## 2016-10-21 DIAGNOSIS — L309 Dermatitis, unspecified: Secondary | ICD-10-CM | POA: Diagnosis not present

## 2016-10-21 DIAGNOSIS — O24111 Pre-existing diabetes mellitus, type 2, in pregnancy, first trimester: Secondary | ICD-10-CM | POA: Diagnosis not present

## 2016-10-21 DIAGNOSIS — E119 Type 2 diabetes mellitus without complications: Secondary | ICD-10-CM | POA: Insufficient documentation

## 2016-10-21 DIAGNOSIS — Z9119 Patient's noncompliance with other medical treatment and regimen: Secondary | ICD-10-CM | POA: Diagnosis not present

## 2016-10-21 DIAGNOSIS — O10919 Unspecified pre-existing hypertension complicating pregnancy, unspecified trimester: Secondary | ICD-10-CM | POA: Insufficient documentation

## 2016-10-21 DIAGNOSIS — O0991 Supervision of high risk pregnancy, unspecified, first trimester: Secondary | ICD-10-CM

## 2016-10-21 DIAGNOSIS — Z3A12 12 weeks gestation of pregnancy: Secondary | ICD-10-CM

## 2016-10-21 DIAGNOSIS — Z9049 Acquired absence of other specified parts of digestive tract: Secondary | ICD-10-CM | POA: Diagnosis not present

## 2016-10-21 DIAGNOSIS — Z3400 Encounter for supervision of normal first pregnancy, unspecified trimester: Secondary | ICD-10-CM

## 2016-10-21 DIAGNOSIS — O0990 Supervision of high risk pregnancy, unspecified, unspecified trimester: Secondary | ICD-10-CM | POA: Insufficient documentation

## 2016-10-21 DIAGNOSIS — E118 Type 2 diabetes mellitus with unspecified complications: Secondary | ICD-10-CM

## 2016-10-21 DIAGNOSIS — O10911 Unspecified pre-existing hypertension complicating pregnancy, first trimester: Secondary | ICD-10-CM | POA: Diagnosis not present

## 2016-10-21 DIAGNOSIS — O219 Vomiting of pregnancy, unspecified: Secondary | ICD-10-CM

## 2016-10-21 DIAGNOSIS — Z91199 Patient's noncompliance with other medical treatment and regimen due to unspecified reason: Secondary | ICD-10-CM | POA: Insufficient documentation

## 2016-10-21 MED ORDER — DOXYLAMINE-PYRIDOXINE 10-10 MG PO TBEC: DELAYED_RELEASE_TABLET | ORAL | 4 refills | Status: DC

## 2016-10-21 MED ORDER — ASPIRIN 81 MG PO CHEW: 81.0000 mg | CHEWABLE_TABLET | Freq: Every day | ORAL | 12 refills | Status: DC

## 2016-10-21 MED ORDER — TRIAMCINOLONE ACETONIDE 0.5 % EX OINT: 1.0000 "application " | TOPICAL_OINTMENT | Freq: Two times a day (BID) | CUTANEOUS | 99 refills | Status: DC

## 2016-10-21 NOTE — Progress Notes (Signed)
Subjective:    Meredith Harrington is being seen today for her first obstetrical visit.  This is a planned pregnancy. She is at Unknown gestation. Her obstetrical history is significant for obesity and CHTN. Relationship with FOB: significant other, living together. Patient does intend to breast feed. Pregnancy history fully reviewed.  The information documented in the HPI was reviewed and verified.  Menstrual History: OB History    Gravida Para Term Preterm AB Living   1 0 0 0 0 0   SAB TAB Ectopic Multiple Live Births   0 0 0 0         Patient's last menstrual period was 07/28/2016.    Past Medical History:  Diagnosis Date  . Hypertension 05/2016  . Vaginal Pap smear, abnormal     Past Surgical History:  Procedure Laterality Date  . CHOLECYSTECTOMY       (Not in a hospital admission) Allergies  Allergen Reactions  . Shellfish Allergy     Social History  Substance Use Topics  . Smoking status: Never Smoker  . Smokeless tobacco: Never Used  . Alcohol use Yes     Comment: ocasional on holidays    Family History  Problem Relation Age of Onset  . Hypertension Mother 20  . Diabetes Mother 40  . Stroke Mother 51  . Hypertension Father   . Diabetes Father      Review of Systems Constitutional: negative for weight loss Gastrointestinal: + for nausea & vomiting Genitourinary:negative for genital lesions and vaginal discharge and dysuria Musculoskeletal:negative for back pain Behavioral/Psych: negative for abusive relationship, depression, illegal drug usage and tobacco use    Objective:    BP 137/86   Pulse 88   Wt 240 lb 9.6 oz (109.1 kg)   LMP 07/28/2016   BMI 38.83 kg/m  General Appearance:    Alert, cooperative, no distress, appears stated age  Head:    Normocephalic, without obvious abnormality, atraumatic  Eyes:    PERRL, conjunctiva/corneas clear, EOM's intact, fundi    benign, both eyes  Ears:    Normal TM's and external ear canals, both ears  Nose:    Nares normal, septum midline, mucosa normal, no drainage    or sinus tenderness  Throat:   Lips, mucosa, and tongue normal; teeth and gums normal  Neck:   Supple, symmetrical, trachea midline, no adenopathy;    thyroid:  no enlargement/tenderness/nodules; no carotid   bruit or JVD  Back:     Symmetric, no curvature, ROM normal, no CVA tenderness  Lungs:     Clear to auscultation bilaterally, respirations unlabored  Chest Wall:    No tenderness or deformity   Heart:    Regular rate and rhythm, S1 and S2 normal, no murmur, rub   or gallop  Breast Exam:    No tenderness, masses, or nipple abnormality  Abdomen:     Soft, non-tender, bowel sounds active all four quadrants,    no masses, no organomegaly  Genitalia:    Normal female without lesion, discharge or tenderness  Extremities:   Extremities normal, atraumatic, no cyanosis or edema  Pulses:   2+ and symmetric all extremities  Skin:   Skin color, texture, turgor normal, no rashes or lesions  Lymph nodes:   Cervical, supraclavicular, and axillary nodes normal  Neurologic:   CNII-XII intact, normal strength, sensation and reflexes    throughout     Cervix: long, thick, closed and posterior.  FHR: 158 by doppler.  FH: c/w dates.  Lab Review Urine pregnancy test Labs reviewed no Radiologic studies reviewed no  Assessment & Plan    Pregnancy at [redacted]w[redacted]d    1. Supervision of normal first pregnancy, antepartum      High Risk d/t CHTN, DM2, medical non compliance discovered during visit.   2. Chronic hypertension during pregnancy, antepartum      Not currently on medications, normotensive today.   - Comp Met (CMET) - Protein / creatinine ratio, urine - aspirin 81 MG chewable tablet; Chew 1 tablet (81 mg total) by mouth daily.  Dispense: 30 tablet; Refill: 12  3. Supervision of high risk pregnancy, antepartum         - Culture, OB Urine - Hemoglobinopathy evaluation - Obstetric Panel, Including HIV - Varicella zoster antibody,  IgG - VITAMIN D 25 Hydroxy (Vit-D Deficiency, Fractures) - Cervicovaginal ancillary only - Cytology - PAP - Hemoglobin A1c - Inheritest Society Guided - Prenatal MV & Min w/FA-DHA (PRENATAL ADULT GUMMY/DHA/FA PO); Take by mouth. - [place]FM NT, genetic testing/counseling, MFM consult placed.   4. S/P cholecystectomy      Lap Chole  5. Type 2 diabetes mellitus with complication, without long-term current use of insulin (HCC)       Had metformin prescribed 3 years ago, never took or followed up.  A1C today.   6. Medical non-compliance       7. Nausea and vomiting during pregnancy prior to [redacted] weeks gestation      - Doxylamine-Pyridoxine (DICLEGIS) 10-10 MG TBEC; Take 1 tablet with breakfast and lunch.  Take 2 tablets at bedtime.  Dispense: 100 tablet; Refill: 4      Prenatal vitamins.  Counseling provided regarding continued use of seat belts, cessation of alcohol consumption, smoking or use of illicit drugs; infection precautions i.e., influenza/TDAP immunizations, toxoplasmosis,CMV, parvovirus, listeria and varicella; workplace safety, exercise during pregnancy; routine dental care, safe medications, sexual activity, hot tubs, saunas, pools, travel, caffeine use, fish and methlymercury, potential toxins, hair treatments, varicose veins Weight gain recommendations per IOM guidelines reviewed: underweight/BMI< 18.5--> gain 28 - 40 lbs; normal weight/BMI 18.5 - 24.9--> gain 25 - 35 lbs; overweight/BMI 25 - 29.9--> gain 15 - 25 lbs; obese/BMI >30->gain  11 - 20 lbs Problem list reviewed and updated. FIRST/CF mutation testing/NIPT/QUAD SCREEN/fragile X/Ashkenazi Jewish population testing/Spinal muscular atrophy discussed: requested. Role of ultrasound in pregnancy discussed; fetal survey: ordered. Amniocentesis discussed: not indicated.  Meds ordered this encounter  Medications  . Prenatal MV & Min w/FA-DHA (PRENATAL ADULT GUMMY/DHA/FA PO)    Sig: Take by mouth.  .Marland Kitchenaspirin 81 MG chewable  tablet    Sig: Chew 1 tablet (81 mg total) by mouth daily.    Dispense:  30 tablet    Refill:  12   Orders Placed This Encounter  Procedures  . Culture, OB Urine  . Hemoglobinopathy evaluation  . Obstetric Panel, Including HIV  . Varicella zoster antibody, IgG  . VITAMIN D 25 Hydroxy (Vit-D Deficiency, Fractures)  . Hemoglobin A1c  . Inheritest Society Guided  . Comp Met (CMET)  . Protein / creatinine ratio, urine    Follow up in 4 weeks. 50% of 30 min visit spent on counseling and coordination of care.

## 2016-10-21 NOTE — Addendum Note (Signed)
Addended by: Orvilla CornwallENNEY, Leslie Jester A on: 10/21/2016 04:03 PM   Modules accepted: Orders

## 2016-10-22 LAB — CYTOLOGY - PAP
Diagnosis: NEGATIVE
HPV: NOT DETECTED

## 2016-10-22 LAB — CERVICOVAGINAL ANCILLARY ONLY
BACTERIAL VAGINITIS: NEGATIVE
CANDIDA VAGINITIS: NEGATIVE
CHLAMYDIA, DNA PROBE: NEGATIVE
Neisseria Gonorrhea: NEGATIVE
TRICH (WINDOWPATH): NEGATIVE

## 2016-10-22 LAB — PROTEIN / CREATININE RATIO, URINE
Creatinine, Urine: 351 mg/dL
PROTEIN UR: 31.9 mg/dL
PROTEIN/CREAT RATIO: 91 mg/g{creat} (ref 0–200)

## 2016-10-23 ENCOUNTER — Other Ambulatory Visit: Payer: Self-pay | Admitting: Certified Nurse Midwife

## 2016-10-23 DIAGNOSIS — O0990 Supervision of high risk pregnancy, unspecified, unspecified trimester: Secondary | ICD-10-CM

## 2016-10-24 LAB — OBSTETRIC PANEL, INCLUDING HIV
ANTIBODY SCREEN: NEGATIVE
BASOS: 0 %
Basophils Absolute: 0 10*3/uL (ref 0.0–0.2)
EOS (ABSOLUTE): 0.1 10*3/uL (ref 0.0–0.4)
EOS: 1 %
HEMATOCRIT: 33.6 % — AB (ref 34.0–46.6)
HEMOGLOBIN: 12 g/dL (ref 11.1–15.9)
HIV SCREEN 4TH GENERATION: NONREACTIVE
Hepatitis B Surface Ag: NEGATIVE
Immature Grans (Abs): 0.1 10*3/uL (ref 0.0–0.1)
Immature Granulocytes: 1 %
LYMPHS: 16 %
Lymphocytes Absolute: 2 10*3/uL (ref 0.7–3.1)
MCH: 28.4 pg (ref 26.6–33.0)
MCHC: 35.7 g/dL (ref 31.5–35.7)
MCV: 79 fL (ref 79–97)
MONOCYTES: 5 %
Monocytes Absolute: 0.7 10*3/uL (ref 0.1–0.9)
NEUTROS ABS: 9.6 10*3/uL — AB (ref 1.4–7.0)
Neutrophils: 77 %
Platelets: 293 10*3/uL (ref 150–379)
RBC: 4.23 x10E6/uL (ref 3.77–5.28)
RDW: 16.4 % — ABNORMAL HIGH (ref 12.3–15.4)
RH TYPE: POSITIVE
RPR Ser Ql: NONREACTIVE
RUBELLA: 2.4 {index} (ref >0.99)
WBC: 12.4 10*3/uL — ABNORMAL HIGH (ref 3.4–10.8)

## 2016-10-24 LAB — COMPREHENSIVE METABOLIC PANEL
ALBUMIN: 4.1 g/dL (ref 3.5–5.5)
ALT: 16 IU/L (ref 0–32)
AST: 17 IU/L (ref 0–40)
Albumin/Globulin Ratio: 1.3 (ref 1.2–2.2)
Alkaline Phosphatase: 68 IU/L (ref 39–117)
BUN/Creatinine Ratio: 7 — ABNORMAL LOW (ref 9–23)
BUN: 5 mg/dL — AB (ref 6–20)
Bilirubin Total: 0.3 mg/dL (ref 0.0–1.2)
CALCIUM: 9.6 mg/dL (ref 8.7–10.2)
CHLORIDE: 99 mmol/L (ref 96–106)
CO2: 20 mmol/L (ref 20–29)
CREATININE: 0.7 mg/dL (ref 0.57–1.00)
GFR, EST AFRICAN AMERICAN: 134 mL/min/{1.73_m2} (ref >59)
GFR, EST NON AFRICAN AMERICAN: 117 mL/min/{1.73_m2} (ref >59)
GLUCOSE: 211 mg/dL — AB (ref 65–99)
Globulin, Total: 3.1 g/dL (ref 1.5–4.5)
Potassium: 3.8 mmol/L (ref 3.5–5.2)
Sodium: 135 mmol/L (ref 134–144)
TOTAL PROTEIN: 7.2 g/dL (ref 6.0–8.5)

## 2016-10-24 LAB — HEMOGLOBIN A1C
ESTIMATED AVERAGE GLUCOSE: 157 mg/dL
Hgb A1c MFr Bld: 7.1 % — ABNORMAL HIGH (ref 4.8–5.6)

## 2016-10-24 LAB — HEMOGLOBINOPATHY EVALUATION
HGB C: 36.7 % — ABNORMAL HIGH
HGB S: 0 %
HGB VARIANT: 0 %
Hemoglobin A2 Quantitation: 2.9 % (ref 1.8–3.2)
Hemoglobin F Quantitation: 0 % (ref 0.0–2.0)
Hgb A: 60.4 % — ABNORMAL LOW (ref 96.4–98.8)

## 2016-10-24 LAB — VITAMIN D 25 HYDROXY (VIT D DEFICIENCY, FRACTURES): Vit D, 25-Hydroxy: 16.9 ng/mL — ABNORMAL LOW (ref 30.0–100.0)

## 2016-10-24 LAB — VARICELLA ZOSTER ANTIBODY, IGG: Varicella zoster IgG: 495 index (ref >165)

## 2016-10-25 LAB — URINE CULTURE, OB REFLEX

## 2016-10-25 LAB — CULTURE, OB URINE

## 2016-10-28 ENCOUNTER — Other Ambulatory Visit: Payer: Self-pay | Admitting: Certified Nurse Midwife

## 2016-10-28 DIAGNOSIS — R7989 Other specified abnormal findings of blood chemistry: Secondary | ICD-10-CM | POA: Insufficient documentation

## 2016-10-28 DIAGNOSIS — D582 Other hemoglobinopathies: Secondary | ICD-10-CM

## 2016-10-28 DIAGNOSIS — E118 Type 2 diabetes mellitus with unspecified complications: Secondary | ICD-10-CM

## 2016-10-28 DIAGNOSIS — B951 Streptococcus, group B, as the cause of diseases classified elsewhere: Secondary | ICD-10-CM | POA: Insufficient documentation

## 2016-10-28 MED ORDER — VITAMIN D (ERGOCALCIFEROL) 1.25 MG (50000 UNIT) PO CAPS: 50000.0000 [IU] | ORAL_CAPSULE | ORAL | 2 refills | Status: DC

## 2016-10-28 MED ORDER — NITROFURANTOIN MONOHYD MACRO 100 MG PO CAPS: 100.0000 mg | ORAL_CAPSULE | Freq: Two times a day (BID) | ORAL | 0 refills | Status: DC

## 2016-11-04 ENCOUNTER — Encounter: Payer: Self-pay | Admitting: *Deleted

## 2016-11-04 ENCOUNTER — Other Ambulatory Visit: Payer: Self-pay | Admitting: *Deleted

## 2016-11-04 DIAGNOSIS — O24410 Gestational diabetes mellitus in pregnancy, diet controlled: Secondary | ICD-10-CM

## 2016-11-04 MED ORDER — GLUCOSE BLOOD VI STRP: ORAL_STRIP | 12 refills | Status: AC

## 2016-11-04 MED ORDER — ACCU-CHEK GUIDE W/DEVICE KIT: 1.0000 | PACK | Freq: Once | 0 refills | Status: AC

## 2016-11-04 MED ORDER — ACCU-CHEK FASTCLIX LANCETS MISC: 1.0000 | Freq: Four times a day (QID) | 12 refills | Status: AC

## 2016-11-04 NOTE — Progress Notes (Signed)
Diabetic supplies ordered

## 2016-11-05 ENCOUNTER — Other Ambulatory Visit: Payer: Self-pay | Admitting: Certified Nurse Midwife

## 2016-11-05 DIAGNOSIS — D582 Other hemoglobinopathies: Secondary | ICD-10-CM

## 2016-11-05 LAB — INHERITEST SOCIETY GUIDED

## 2016-11-10 ENCOUNTER — Encounter: Payer: Self-pay | Admitting: Obstetrics and Gynecology

## 2016-11-13 ENCOUNTER — Ambulatory Visit: Payer: Medicaid Other | Admitting: Skilled Nursing Facility1

## 2016-11-16 ENCOUNTER — Encounter: Payer: Self-pay | Admitting: Obstetrics and Gynecology

## 2016-11-17 ENCOUNTER — Encounter: Payer: Self-pay | Admitting: Obstetrics and Gynecology

## 2016-11-18 ENCOUNTER — Ambulatory Visit (INDEPENDENT_AMBULATORY_CARE_PROVIDER_SITE_OTHER): Payer: Medicaid Other | Admitting: Obstetrics and Gynecology

## 2016-11-18 ENCOUNTER — Encounter: Payer: Self-pay | Admitting: Obstetrics and Gynecology

## 2016-11-18 VITALS — BP 125/86 | HR 73 | Wt 247.1 lb

## 2016-11-18 DIAGNOSIS — E118 Type 2 diabetes mellitus with unspecified complications: Secondary | ICD-10-CM

## 2016-11-18 DIAGNOSIS — B951 Streptococcus, group B, as the cause of diseases classified elsewhere: Secondary | ICD-10-CM

## 2016-11-18 DIAGNOSIS — O0990 Supervision of high risk pregnancy, unspecified, unspecified trimester: Secondary | ICD-10-CM

## 2016-11-18 DIAGNOSIS — O10919 Unspecified pre-existing hypertension complicating pregnancy, unspecified trimester: Secondary | ICD-10-CM

## 2016-11-18 LAB — OB RESULTS CONSOLE GBS: GBS: POSITIVE

## 2016-11-18 MED ORDER — METFORMIN HCL 500 MG PO TABS: 500.0000 mg | ORAL_TABLET | Freq: Two times a day (BID) | ORAL | 5 refills | Status: DC

## 2016-11-18 NOTE — Progress Notes (Signed)
   PRENATAL VISIT NOTE  Subjective:  Meredith Harrington is a 31 y.o. G1P0000 at 5659w1d being seen today for ongoing prenatal care.  She is currently monitored for the following issues for this high-risk pregnancy and has Supervision of high risk pregnancy, antepartum; Chronic hypertension during pregnancy, antepartum; S/P cholecystectomy; DM (diabetes mellitus) (HCC); Medical non-compliance; Positive GBS test; Low vitamin D level; and Hemoglobin C trait (HCC) on her problem list.  Patient reports no complaints.  Contractions: Not present. Vag. Bleeding: None.  Movement: Absent. Denies leaking of fluid.   The following portions of the patient's history were reviewed and updated as appropriate: allergies, current medications, past family history, past medical history, past social history, past surgical history and problem list. Problem list updated.  Objective:   Vitals:   11/18/16 1028  BP: 125/86  Pulse: 73  Weight: 247 lb 1.6 oz (112.1 kg)    Fetal Status: Fetal Heart Rate (bpm): 144   Movement: Absent     General:  Alert, oriented and cooperative. Patient is in no acute distress.  Skin: Skin is warm and dry. No rash noted.   Cardiovascular: Normal heart rate noted  Respiratory: Normal respiratory effort, no problems with respiration noted  Abdomen: Soft, gravid, appropriate for gestational age.  Pain/Pressure: Absent     Pelvic: Cervical exam deferred        Extremities: Normal range of motion.  Edema: None  Mental Status:  Normal mood and affect. Normal behavior. Normal judgment and thought content.   Assessment and Plan:  Pregnancy: G1P0000 at 759w1d  1. Type 2 diabetes mellitus with complication, without long-term current use of insulin (HCC) CBGs reviewed and great majority are elevated 130-150. A few values of 115-117 when patient admits to being adherent with the diet. She admits to eating cake and ice cream daily. She has attended several weddings and has not been mindful of her  diet Rx metformin provided Patient scheduled to meet diabetic educator on 7/26 RTC in 2 weeks to review CBG and need to start glyburide if indicated Dental and eye exam referral provided - Ambulatory referral to Ophthalmology - Ambulatory referral to Dentistry  2. Positive GBS test Will provide prophylaxis in labor  3. Supervision of high risk pregnancy, antepartum Patient is doing well without complaints Anatomy ultrasound scheduled on 8/14 - Culture, OB Urine  4. Chronic hypertension during pregnancy, antepartum Continue ASA  General obstetric precautions including but not limited to vaginal bleeding, contractions, leaking of fluid and fetal movement were reviewed in detail with the patient. Please refer to After Visit Summary for other counseling recommendations.  Return in about 2 weeks (around 12/02/2016) for ROB.   Catalina AntiguaPeggy Yaeli Hartung, MD

## 2016-11-20 ENCOUNTER — Encounter: Payer: Medicaid Other | Attending: Certified Nurse Midwife | Admitting: Skilled Nursing Facility1

## 2016-11-20 ENCOUNTER — Encounter: Payer: Self-pay | Admitting: Skilled Nursing Facility1

## 2016-11-20 DIAGNOSIS — E118 Type 2 diabetes mellitus with unspecified complications: Secondary | ICD-10-CM | POA: Insufficient documentation

## 2016-11-20 DIAGNOSIS — Z713 Dietary counseling and surveillance: Secondary | ICD-10-CM | POA: Diagnosis not present

## 2016-11-20 DIAGNOSIS — O24312 Unspecified pre-existing diabetes mellitus in pregnancy, second trimester: Secondary | ICD-10-CM

## 2016-11-20 NOTE — Progress Notes (Signed)
Pt states she is 4 month along. Pt states she has had diabetes for about 3 years. Works for the [employer].  Pt states her A1C was 7.5 and is now 7.1.   Pt checked her blood sugar and got 142: last eating around 12.   Pt became nauseous during the appointemnt and felt she needed to vomit. Pt was given peanut butter crackers and water and she felt much better. Pt often spends her off time sleeping and going multiple hours without eating as well as leading an inactive lifestyle.  Diabetes Self-Management Education  Visit Type: First/Initial  11/20/2016  Meredith Harrington, identified by name and date of birth, is a 31 y.o. female with a diagnosis of Diabetes: Type 2.   ASSESSMENT  Height 5\' 6"  (1.676 m), weight 245 lb 1.6 oz (111.2 kg), last menstrual period 07/28/2016. Body mass index is 39.56 kg/m.      Diabetes Self-Management Education - 11/20/16 1558      Visit Information   Visit Type First/Initial     Initial Visit   Diabetes Type Type 2   Are you currently following a meal plan? No   Are you taking your medications as prescribed? Yes     Health Coping   How would you rate your overall health? Fair     Psychosocial Assessment   Patient Belief/Attitude about Diabetes Motivated to manage diabetes     Pre-Education Assessment   Patient understands the diabetes disease and treatment process. Needs Instruction   Patient understands incorporating nutritional management into lifestyle. Needs Instruction   Patient undertands incorporating physical activity into lifestyle. Needs Instruction   Patient understands using medications safely. Needs Instruction   Patient understands monitoring blood glucose, interpreting and using results Needs Instruction   Patient understands prevention, detection, and treatment of acute complications. Needs Instruction   Patient understands prevention, detection, and treatment of chronic complications. Needs Instruction   Patient  understands how to develop strategies to address psychosocial issues. Needs Instruction   Patient understands how to develop strategies to promote health/change behavior. Needs Instruction     Complications   How often do you check your blood sugar? 3-4 times/day   Number of hypoglycemic episodes per month 0   Number of hyperglycemic episodes per week 0   Have you had a dilated eye exam in the past 12 months? No   Have you had a dental exam in the past 12 months? No   Are you checking your feet? No     Dietary Intake   Breakfast cereal    Snack (morning) fruit    Lunch tuna sandwich-----cucumber tomato onion salad---tacos   Snack (afternoon) sugar free waffers   Dinner manwich-----hamburger ------fruit   Beverage(s) water with flavorings      Exercise   Exercise Type ADL's     Patient Education   Previous Diabetes Education No   Disease state  Definition of diabetes, type 1 and 2, and the diagnosis of diabetes;Factors that contribute to the development of diabetes   Nutrition management  Role of diet in the treatment of diabetes and the relationship between the three main macronutrients and blood glucose level;Food label reading, portion sizes and measuring food.;Carbohydrate counting;Reviewed blood glucose goals for pre and post meals and how to evaluate the patients' food intake on their blood glucose level.;Information on hints to eating out and maintain blood glucose control.   Physical activity and exercise  Role of exercise on diabetes management, blood pressure control and  cardiac health.;Identified with patient nutritional and/or medication changes necessary with exercise.   Monitoring Purpose and frequency of SMBG.;Yearly dilated eye exam;Daily foot exams   Chronic complications Relationship between chronic complications and blood glucose control;Assessed and discussed foot care and prevention of foot problems;Dental care;Nephropathy, what it is, prevention of, the use of ACE,  ARB's and early detection of through urine microalbumia.   Psychosocial adjustment Worked with patient to identify barriers to care and solutions;Role of stress on diabetes     Individualized Goals (developed by patient)   Nutrition Follow meal plan discussed;General guidelines for healthy choices and portions discussed   Physical Activity Exercise 5-7 days per week;15 minutes per day   Medications take my medication as prescribed   Monitoring  test my blood glucose as discussed;test blood glucose pre and post meals as discussed     Post-Education Assessment   Patient understands the diabetes disease and treatment process. Demonstrates understanding / competency   Patient understands incorporating nutritional management into lifestyle. Demonstrates understanding / competency   Patient undertands incorporating physical activity into lifestyle. Demonstrates understanding / competency   Patient understands using medications safely. Demonstrates understanding / competency   Patient understands monitoring blood glucose, interpreting and using results Demonstrates understanding / competency   Patient understands prevention, detection, and treatment of acute complications. Demonstrates understanding / competency   Patient understands prevention, detection, and treatment of chronic complications. Demonstrates understanding / competency   Patient understands how to develop strategies to address psychosocial issues. Demonstrates understanding / competency   Patient understands how to develop strategies to promote health/change behavior. Demonstrates understanding / competency     Outcomes   Expected Outcomes Demonstrated interest in learning. Expect positive outcomes   Future DMSE PRN   Program Status Completed      Individualized Plan for Diabetes Self-Management Training:   Learning Objective:  Patient will have a greater understanding of diabetes self-management. Patient education plan is to  attend individual and/or group sessions per assessed needs and concerns.   Plan:   There are no Patient Instructions on file for this visit.  -Start out walking for 15 minutes every day and increase as comfortable   Expected Outcomes:  Demonstrated interest in learning. Expect positive outcomes  Education material provided: Living Well with Diabetes, Meal plan card, My Plate and Snack sheet  If problems or questions, patient to contact team via:  Phone  Future DSME appointment: PRN

## 2016-11-26 LAB — CULTURE, OB URINE

## 2016-11-26 LAB — URINE CULTURE, OB REFLEX

## 2016-11-27 ENCOUNTER — Other Ambulatory Visit: Payer: Self-pay | Admitting: Obstetrics and Gynecology

## 2016-11-27 MED ORDER — PENICILLIN V POTASSIUM 500 MG PO TABS: 500.0000 mg | ORAL_TABLET | Freq: Four times a day (QID) | ORAL | 0 refills | Status: DC

## 2016-12-03 ENCOUNTER — Encounter: Payer: Medicaid Other | Admitting: Obstetrics & Gynecology

## 2016-12-09 ENCOUNTER — Ambulatory Visit (HOSPITAL_COMMUNITY)
Admission: RE | Admit: 2016-12-09 | Discharge: 2016-12-09 | Disposition: A | Discharge status: Home / Self Care | Payer: Medicaid Other | Source: Ambulatory Visit | Attending: Certified Nurse Midwife | Admitting: Certified Nurse Midwife

## 2016-12-09 ENCOUNTER — Other Ambulatory Visit: Payer: Self-pay | Admitting: Certified Nurse Midwife

## 2016-12-09 ENCOUNTER — Other Ambulatory Visit (HOSPITAL_COMMUNITY): Payer: Self-pay | Admitting: *Deleted

## 2016-12-09 ENCOUNTER — Encounter (HOSPITAL_COMMUNITY): Payer: Self-pay

## 2016-12-09 DIAGNOSIS — Z7984 Long term (current) use of oral hypoglycemic drugs: Principal | ICD-10-CM

## 2016-12-09 DIAGNOSIS — Z3A19 19 weeks gestation of pregnancy: Secondary | ICD-10-CM | POA: Diagnosis not present

## 2016-12-09 DIAGNOSIS — O24319 Unspecified pre-existing diabetes mellitus in pregnancy, unspecified trimester: Secondary | ICD-10-CM

## 2016-12-09 DIAGNOSIS — O99212 Obesity complicating pregnancy, second trimester: Secondary | ICD-10-CM | POA: Diagnosis not present

## 2016-12-09 DIAGNOSIS — E118 Type 2 diabetes mellitus with unspecified complications: Secondary | ICD-10-CM

## 2016-12-09 DIAGNOSIS — Z363 Encounter for antenatal screening for malformations: Secondary | ICD-10-CM | POA: Insufficient documentation

## 2016-12-09 DIAGNOSIS — D582 Other hemoglobinopathies: Secondary | ICD-10-CM

## 2016-12-09 DIAGNOSIS — O24112 Pre-existing diabetes mellitus, type 2, in pregnancy, second trimester: Secondary | ICD-10-CM | POA: Insufficient documentation

## 2016-12-09 DIAGNOSIS — O352XX1 Maternal care for (suspected) hereditary disease in fetus, fetus 1: Secondary | ICD-10-CM | POA: Insufficient documentation

## 2016-12-09 HISTORY — DX: Type 2 diabetes mellitus without complications: E11.9

## 2016-12-09 NOTE — Addendum Note (Signed)
Encounter addended by: Aundra MilletKiser, Kaiser Belluomini E on: 12/09/2016 11:42 AM<BR>    Actions taken: Imaging Exam ended

## 2016-12-09 NOTE — Progress Notes (Signed)
Genetic Counseling  High-Risk Gestation Note  Appointment Date:  12/09/2016 Referred By: Morene Crocker, CNM Date of Birth:  [birth year]   Pregnancy History: [id number] Estimated Date of Delivery: 05/04/17 Estimated Gestational Age: 70w1dAttending: NGriffin Dakin MD  I met with 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     In summary:  Discussed hemoglobinopathies, hemoglobin C disease, hemoglobin [place] disease  Reviewed the autosomal recessive inheritance of these conditions  Discussed availability of carrier screening for the father of the baby ? Reviewed 1 in 40 risk of the fetus having a hemoglobinopathy   ? Patient will discuss the option of hemoglobin electrophoresis with the FOB and call MFM or her referring provider if he is interested in testing  Discussed option of prenatal diagnosis  ? Declined amniocentesis at this time ? Patient wishes to pursue newborn screening  Other family history concerns ? Patient reported a family history of early onset cancer (1 relative on each side of her family) ? Patient will call if she is interested in a referral to a cancer genetic counselor  The patient had routine screening through her referring provider's office, which identified that she has hemoglobin C trait (Hb AC, or Hb C trait).  We discussed that hemoglobin is the oxygen-carrying pigment of red blood cells.  The type of hemoglobin we have is determined by inheritance.  Most people typically inherit hemoglobin A (normal adult hemoglobin). Other variant types of hemoglobin include hemoglobin C or hemoglobin S.  If an individual inherits hemoglobin A from one parent and hemoglobin C from the other parent, that individual has hemoglobin C trait (noted as hemoglobin AC).  Likewise, an individual who inherits hemoglobin A from one parent and hemoglobin S from the other parent has hemoglobin S  trait (sickle cell trait or hemoglobin AS).  Hemoglobin C trait and sickle cell trait are not associated with any illness and there are generally no symptoms.  We reviewed genes and chromosomes. Meredith Harrington counseled that hemoglobinopathies are typically inherited in an autosomal recessive manner, and occur when both copies of the hemoglobin gene are changed and produce an abnormal hemoglobin. Typically, one abnormal gene for the production of hemoglobin is inherited from each parent. Both the mother and the father of the baby must carry a variant hemoglobin such as hemoglobin S or C in order for a pregnancy to be at increased risk for a disease such as sickle cell disease (hemoglobin SS) or hemoglobin C disease (hemoglobin CC) in the pregnancy. Individuals with sickle cell disease/anemia have red blood cells that are not able to carry enough oxygen to all the tissues of the body, which may result in sickle cell crises, delays in growth and development, repeated infections, or hospitalization for blood transfusions and pain management. Since [name] not carry hemoglobin S, this and future pregnancies are not expected to be at risk to inherit sickle cell disease (hemoglobin SS). Individuals with hemoglobin [place] disease are typically described to have milder features than individuals with hemoglobin SS. Hemoglobin C disease is generally a benign condition, although it can be associated with mild hemolytic anemia and/or splenomegaly (enlarged spleen).  If an enlarged spleen is painful, sometimes a splenectomy (surgical removal of the spleen) is performed.    Given the recessive inheritance, we discussed the importance of understanding the father of the baby's carrier status in order to accurately predict the risk of a hemoglobinopathy in  the fetus. Meredith Harrington reported that her partner has not had screening for hemoglobinopathies, and to her knowledge has no known relatives with sickle cell anemia or  hemoglobin C disease. Prior to carrier screening, he would have the general population chance of approximately 1 in 10 to have sickle cell or hemoglobin C trait, meaning that the pregnancy has an approximate 1 in 40 chance for hemoglobin [place] or C disease. When both parents are identified carriers, prenatal diagnosis via amniocentesis would be available. Meredith Harrington indicated that she would not be interested in amniocentesis in the pregnancy, given the associated risks for complications. We discussed the option of testing via hemoglobin electrophoresis for the FOB to determine whether he has any hemoglobin variant. Meredith Harrington will notify us or her referring provider's office if he is interested in having this testing performed. They understand that an accurate risk assessment cannot be performed without further information. We also reviewed the availability of newborn screening in [place] for hemoglobinopathies.   Both family histories were reviewed and were found to be contributory for Meredith Harrington having a maternal cousin who has intellectual disability versus a speech delay of unknown etiology. She was counseled that there are many different causes of intellectual disabilities including environmental, multifactorial, and genetic etiologies.  We discussed that a specific diagnosis for intellectual disability can be determined in approximately 50% of these individuals.  In the remaining 50% of individuals, a diagnosis may never be determined.  Regarding genetic causes, we discussed that chromosome aberrations (aneuploidy, deletions, duplications, insertions, and translocations) are responsible for a small percentage of individuals with intellectual disability.  Many individuals with chromosome aberrations have additional differences, including congenital anomalies or minor dysmorphisms.  Likewise, single gene conditions are the underlying cause of intellectual delay in some families.  We discussed that many  gene conditions have intellectual disability as a feature, but also often include other physical or medical differences.  We discussed that without more specific information, it is difficult to provide an accurate risk assessment.  Further genetic counseling is warranted if more information is obtained.  Meredith Harrington also reported that her partner's mother and maybe some other relatives (questionable) have a history of mental illness, bipolar disorder. We discussed that mental illnesses are often multifactorial in etiology, caused by a combination of both genetic and environmental factors. We discussed that in some families, mental illness can have a dominant pattern of inheritance. Considering the reported family history, we discussed the increased risk for the FOB and potentially his children to have a mental illness. She was counseled that predictive genetic testing for mental illnesses is not currently available. Without further information regarding the provided family history, an accurate genetic risk cannot be calculated. Further genetic counseling is warranted if more information is obtained.  Meredith Harrington denied exposure to environmental toxins or chemical agents. She denied the use of alcohol, tobacco or street drugs. She denied significant viral illnesses during the course of her pregnancy. She reported that she has CHTN and DM. These diagnoses were previously discussed by her referring provider.  I counseled Meredith Harrington regarding the above risks and available options. The approximate face-to-face time with the genetic counselor was 34 minutes.  Electronically signed by: Filbert Schilder, [place]   Certified Genetic Counselor

## 2016-12-11 ENCOUNTER — Other Ambulatory Visit: Payer: Self-pay | Admitting: Certified Nurse Midwife

## 2016-12-11 DIAGNOSIS — O0990 Supervision of high risk pregnancy, unspecified, unspecified trimester: Secondary | ICD-10-CM

## 2016-12-19 ENCOUNTER — Telehealth: Payer: Self-pay

## 2016-12-19 NOTE — Telephone Encounter (Signed)
Called pt left message to call office to reschedule missed appointment.

## 2016-12-22 ENCOUNTER — Other Ambulatory Visit: Payer: Self-pay | Admitting: Obstetrics and Gynecology

## 2016-12-26 ENCOUNTER — Telehealth: Payer: Self-pay

## 2016-12-26 NOTE — Telephone Encounter (Signed)
Attempted to contact about rx that pt requested and appt, left vm.

## 2017-01-07 ENCOUNTER — Ambulatory Visit (HOSPITAL_COMMUNITY)
Admission: RE | Admit: 2017-01-07 | Discharge: 2017-01-07 | Disposition: A | Discharge status: Home / Self Care | Payer: Medicaid Other | Source: Ambulatory Visit | Attending: Obstetrics and Gynecology | Admitting: Obstetrics and Gynecology

## 2017-01-07 ENCOUNTER — Encounter (HOSPITAL_COMMUNITY): Payer: Self-pay

## 2017-01-07 DIAGNOSIS — O24319 Unspecified pre-existing diabetes mellitus in pregnancy, unspecified trimester: Secondary | ICD-10-CM | POA: Insufficient documentation

## 2017-01-07 DIAGNOSIS — Z7984 Long term (current) use of oral hypoglycemic drugs: Secondary | ICD-10-CM | POA: Diagnosis present

## 2017-01-07 DIAGNOSIS — Z3A23 23 weeks gestation of pregnancy: Secondary | ICD-10-CM | POA: Insufficient documentation

## 2017-01-08 ENCOUNTER — Other Ambulatory Visit (HOSPITAL_COMMUNITY): Payer: Self-pay | Admitting: *Deleted

## 2017-01-08 DIAGNOSIS — Z7984 Long term (current) use of oral hypoglycemic drugs: Principal | ICD-10-CM

## 2017-01-08 DIAGNOSIS — O24319 Unspecified pre-existing diabetes mellitus in pregnancy, unspecified trimester: Secondary | ICD-10-CM

## 2017-01-28 ENCOUNTER — Inpatient Hospital Stay (HOSPITAL_COMMUNITY)
Admission: EM | Admit: 2017-01-28 | Discharge: 2017-01-28 | Disposition: A | Discharge status: Other Acute Inpatient Hospital | Payer: Worker's Compensation | Attending: Obstetrics & Gynecology | Admitting: Obstetrics & Gynecology

## 2017-01-28 ENCOUNTER — Encounter (HOSPITAL_COMMUNITY): Payer: Self-pay

## 2017-01-28 DIAGNOSIS — Z3689 Encounter for other specified antenatal screening: Secondary | ICD-10-CM

## 2017-01-28 DIAGNOSIS — Y999 Unspecified external cause status: Secondary | ICD-10-CM | POA: Insufficient documentation

## 2017-01-28 DIAGNOSIS — Z3A26 26 weeks gestation of pregnancy: Secondary | ICD-10-CM | POA: Insufficient documentation

## 2017-01-28 DIAGNOSIS — Y92410 Unspecified street and highway as the place of occurrence of the external cause: Secondary | ICD-10-CM | POA: Diagnosis not present

## 2017-01-28 DIAGNOSIS — S0990XA Unspecified injury of head, initial encounter: Secondary | ICD-10-CM | POA: Insufficient documentation

## 2017-01-28 DIAGNOSIS — O9A212 Injury, poisoning and certain other consequences of external causes complicating pregnancy, second trimester: Secondary | ICD-10-CM | POA: Insufficient documentation

## 2017-01-28 DIAGNOSIS — T1490XA Injury, unspecified, initial encounter: Secondary | ICD-10-CM

## 2017-01-28 DIAGNOSIS — Y939 Activity, unspecified: Secondary | ICD-10-CM | POA: Insufficient documentation

## 2017-01-28 DIAGNOSIS — W19XXXA Unspecified fall, initial encounter: Secondary | ICD-10-CM

## 2017-01-28 MED ORDER — ACETAMINOPHEN 325 MG PO TABS: 650.0000 mg | ORAL_TABLET | Freq: Once | ORAL | Status: DC

## 2017-01-28 NOTE — ED Notes (Signed)
Sitting up to eat.

## 2017-01-28 NOTE — Discharge Instructions (Signed)
Braxton Hicks Contractions °Contractions of the uterus can occur throughout pregnancy, but they are not always a sign that you are in labor. You may have practice contractions called Braxton Hicks contractions. These false labor contractions are sometimes confused with true labor. °What are Braxton Hicks contractions? °Braxton Hicks contractions are tightening movements that occur in the muscles of the uterus before labor. Unlike true labor contractions, these contractions do not result in opening (dilation) and thinning of the cervix. Toward the end of pregnancy (32-34 weeks), Braxton Hicks contractions can happen more often and may become stronger. These contractions are sometimes difficult to tell apart from true labor because they can be very uncomfortable. You should not feel embarrassed if you go to the hospital with false labor. °Sometimes, the only way to tell if you are in true labor is for your health care provider to look for changes in the cervix. The health care provider will do a physical exam and may monitor your contractions. If you are not in true labor, the exam should show that your cervix is not dilating and your water has not broken. °If there are no prenatal problems or other health problems associated with your pregnancy, it is completely safe for you to be sent home with false labor. You may continue to have Braxton Hicks contractions until you go into true labor. °How can I tell the difference between true labor and false labor? °· Differences °? False labor °? Contractions last 30-70 seconds.: Contractions are usually shorter and not as strong as true labor contractions. °? Contractions become very regular.: Contractions are usually irregular. °? Discomfort is usually felt in the top of the uterus, and it spreads to the lower abdomen and low back.: Contractions are often felt in the front of the lower abdomen and in the groin. °? Contractions do not go away with walking.: Contractions may  go away when you walk around or change positions while lying down. °? Contractions usually become more intense and increase in frequency.: Contractions get weaker and are shorter-lasting as time goes on. °? The cervix dilates and gets thinner.: The cervix usually does not dilate or become thin. °Follow these instructions at home: °· Take over-the-counter and prescription medicines only as told by your health care provider. °· Keep up with your usual exercises and follow other instructions from your health care provider. °· Eat and drink lightly if you think you are going into labor. °· If Braxton Hicks contractions are making you uncomfortable: °? Change your position from lying down or resting to walking, or change from walking to resting. °? Sit and rest in a tub of warm water. °? Drink enough fluid to keep your urine clear or pale yellow. Dehydration may cause these contractions. °? Do slow and deep breathing several times an hour. °· Keep all follow-up prenatal visits as told by your health care provider. This is important. °Contact a health care provider if: °· You have a fever. °· You have continuous pain in your abdomen. °Get help right away if: °· Your contractions become stronger, more regular, and closer together. °· You have fluid leaking or gushing from your vagina. °· You pass blood-tinged mucus (bloody show). °· You have bleeding from your vagina. °· You have low back pain that you never had before. °· You feel your baby’s head pushing down and causing pelvic pressure. °· Your baby is not moving inside you as much as it used to. °Summary °· Contractions that occur before labor are   called Braxton Hicks contractions, false labor, or practice contractions. °· Braxton Hicks contractions are usually shorter, weaker, farther apart, and less regular than true labor contractions. True labor contractions usually become progressively stronger and regular and they become more frequent. °· Manage discomfort from  Braxton Hicks contractions by changing position, resting in a warm bath, drinking plenty of water, or practicing deep breathing. °This information is not intended to replace advice given to you by your health care provider. Make sure you discuss any questions you have with your health care provider. °Document Released: 04/14/2005 Document Revised: 03/03/2016 Document Reviewed: 03/03/2016 °Elsevier Interactive Patient Education © 2017 [employer] ° ° °Fetal Movement Counts °Patient Name: ________________________________________________ Patient Due Date: ____________________ °What is a fetal movement count? °A fetal movement count is the number of times that you feel your baby move during a certain amount of time. This may also be called a fetal kick count. A fetal movement count is recommended for every pregnant woman. You may be asked to start counting fetal movements as early as week 28 of your pregnancy. °Pay attention to when your baby is most active. You may notice your baby's sleep and wake cycles. You may also notice things that make your baby move more. You should do a fetal movement count: °· When your baby is normally most active. °· At the same time each day. ° °A good time to count movements is while you are resting, after having something to eat and drink. °How do I count fetal movements? °1. Find a quiet, comfortable area. Sit, or lie down on your side. °2. Write down the date, the start time and stop time, and the number of movements that you felt between those two times. Take this information with you to your health care visits. °3. For 2 hours, count kicks, flutters, swishes, rolls, and jabs. You should feel at least 10 movements during 2 hours. °4. You may stop counting after you have felt 10 movements. °5. If you do not feel 10 movements in 2 hours, have something to eat and drink. Then, keep resting and counting for 1 hour. If you feel at least 4 movements during that hour, you may stop  counting. °Contact a health care provider if: °· You feel fewer than 4 movements in 2 hours. °· Your baby is not moving like he or she usually does. °Date: ____________ Start time: ____________ Stop time: ____________ Movements: ____________ °Date: ____________ Start time: ____________ Stop time: ____________ Movements: ____________ °Date: ____________ Start time: ____________ Stop time: ____________ Movements: ____________ °Date: ____________ Start time: ____________ Stop time: ____________ Movements: ____________ °Date: ____________ Start time: ____________ Stop time: ____________ Movements: ____________ °Date: ____________ Start time: ____________ Stop time: ____________ Movements: ____________ °Date: ____________ Start time: ____________ Stop time: ____________ Movements: ____________ °Date: ____________ Start time: ____________ Stop time: ____________ Movements: ____________ °Date: ____________ Start time: ____________ Stop time: ____________ Movements: ____________ °This information is not intended to replace advice given to you by your health care provider. Make sure you discuss any questions you have with your health care provider. °Document Released: 05/14/2006 Document Revised: 12/12/2015 Document Reviewed: 05/24/2015 °Elsevier Interactive Patient Education © 2018 [employer] ° °

## 2017-01-28 NOTE — ED Notes (Signed)
Secretary notified to page rapid ob
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
## 2017-01-28 NOTE — ED Notes (Signed)
fht 155
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
## 2017-01-28 NOTE — Progress Notes (Signed)
EFM off for transfer by [facility].
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
## 2017-02-04 ENCOUNTER — Ambulatory Visit (HOSPITAL_COMMUNITY)
Admission: RE | Admit: 2017-02-04 | Discharge: 2017-02-04 | Disposition: A | Discharge status: Home / Self Care | Payer: Medicaid Other | Source: Ambulatory Visit | Attending: Certified Nurse Midwife | Admitting: Certified Nurse Midwife

## 2017-02-04 ENCOUNTER — Encounter (HOSPITAL_COMMUNITY): Payer: Self-pay

## 2017-02-04 DIAGNOSIS — O24312 Unspecified pre-existing diabetes mellitus in pregnancy, second trimester: Secondary | ICD-10-CM | POA: Diagnosis not present

## 2017-02-04 DIAGNOSIS — Z6841 Body Mass Index (BMI) 40.0 and over, adult: Secondary | ICD-10-CM | POA: Diagnosis not present

## 2017-02-04 DIAGNOSIS — Z7984 Long term (current) use of oral hypoglycemic drugs: Secondary | ICD-10-CM | POA: Insufficient documentation

## 2017-02-04 DIAGNOSIS — E669 Obesity, unspecified: Secondary | ICD-10-CM | POA: Diagnosis not present

## 2017-02-04 DIAGNOSIS — O99212 Obesity complicating pregnancy, second trimester: Secondary | ICD-10-CM | POA: Diagnosis not present

## 2017-02-04 DIAGNOSIS — O10012 Pre-existing essential hypertension complicating pregnancy, second trimester: Secondary | ICD-10-CM | POA: Diagnosis not present

## 2017-02-04 DIAGNOSIS — O24319 Unspecified pre-existing diabetes mellitus in pregnancy, unspecified trimester: Secondary | ICD-10-CM

## 2017-02-04 DIAGNOSIS — Z3A27 27 weeks gestation of pregnancy: Secondary | ICD-10-CM | POA: Insufficient documentation

## 2017-02-05 ENCOUNTER — Other Ambulatory Visit (HOSPITAL_COMMUNITY): Payer: Self-pay | Admitting: *Deleted

## 2017-02-05 DIAGNOSIS — O10913 Unspecified pre-existing hypertension complicating pregnancy, third trimester: Secondary | ICD-10-CM

## 2017-02-09 ENCOUNTER — Ambulatory Visit (INDEPENDENT_AMBULATORY_CARE_PROVIDER_SITE_OTHER): Payer: Medicaid Other | Admitting: Certified Nurse Midwife

## 2017-02-09 VITALS — BP 121/73 | HR 105 | Wt 255.4 lb

## 2017-02-09 DIAGNOSIS — R7989 Other specified abnormal findings of blood chemistry: Secondary | ICD-10-CM

## 2017-02-09 DIAGNOSIS — O26893 Other specified pregnancy related conditions, third trimester: Secondary | ICD-10-CM

## 2017-02-09 DIAGNOSIS — O0993 Supervision of high risk pregnancy, unspecified, third trimester: Secondary | ICD-10-CM

## 2017-02-09 DIAGNOSIS — B951 Streptococcus, group B, as the cause of diseases classified elsewhere: Secondary | ICD-10-CM

## 2017-02-09 DIAGNOSIS — E118 Type 2 diabetes mellitus with unspecified complications: Secondary | ICD-10-CM

## 2017-02-09 DIAGNOSIS — Z91199 Patient's noncompliance with other medical treatment and regimen due to unspecified reason: Secondary | ICD-10-CM

## 2017-02-09 DIAGNOSIS — N949 Unspecified condition associated with female genital organs and menstrual cycle: Secondary | ICD-10-CM

## 2017-02-09 DIAGNOSIS — Z9119 Patient's noncompliance with other medical treatment and regimen: Secondary | ICD-10-CM

## 2017-02-09 DIAGNOSIS — O10913 Unspecified pre-existing hypertension complicating pregnancy, third trimester: Secondary | ICD-10-CM

## 2017-02-09 DIAGNOSIS — O10919 Unspecified pre-existing hypertension complicating pregnancy, unspecified trimester: Secondary | ICD-10-CM

## 2017-02-09 DIAGNOSIS — J069 Acute upper respiratory infection, unspecified: Secondary | ICD-10-CM

## 2017-02-09 DIAGNOSIS — O0990 Supervision of high risk pregnancy, unspecified, unspecified trimester: Secondary | ICD-10-CM

## 2017-02-09 MED ORDER — FLUCONAZOLE 150 MG PO TABS: 150.0000 mg | ORAL_TABLET | Freq: Once | ORAL | 0 refills | Status: AC

## 2017-02-09 MED ORDER — VITAMIN D (ERGOCALCIFEROL) 1.25 MG (50000 UNIT) PO CAPS: 50000.0000 [IU] | ORAL_CAPSULE | ORAL | 2 refills | Status: DC

## 2017-02-09 MED ORDER — ALBUTEROL SULFATE HFA 108 (90 BASE) MCG/ACT IN AERS: 2.0000 | INHALATION_SPRAY | Freq: Four times a day (QID) | RESPIRATORY_TRACT | 1 refills | Status: DC | PRN

## 2017-02-09 MED ORDER — GLYBURIDE 2.5 MG PO TABS: 2.5000 mg | ORAL_TABLET | Freq: Every day | ORAL | 3 refills | Status: DC

## 2017-02-09 MED ORDER — COMFORT FIT MATERNITY SUPP LG MISC: 1.0000 [IU] | Freq: Every day | 0 refills | Status: DC

## 2017-02-09 MED ORDER — GLYBURIDE 5 MG PO TABS: 5.0000 mg | ORAL_TABLET | Freq: Every day | ORAL | 5 refills | Status: DC

## 2017-02-09 MED ORDER — AMOXICILLIN-POT CLAVULANATE 875-125 MG PO TABS: 1.0000 | ORAL_TABLET | Freq: Two times a day (BID) | ORAL | 0 refills | Status: DC

## 2017-02-09 MED ORDER — BENZONATATE 100 MG PO CAPS: 100.0000 mg | ORAL_CAPSULE | Freq: Three times a day (TID) | ORAL | 0 refills | Status: DC | PRN

## 2017-02-09 NOTE — Patient Instructions (Addendum)
Diabetes Mellitus and Sick Day Management Blood sugar (glucose) can be difficult to control when you are sick. Common illnesses that can cause problems for people with diabetes (diabetes mellitus) include colds, fever, flu (influenza), nausea, vomiting, and diarrhea. These illnesses can cause stress and loss of body fluids (dehydration), and those issues can cause blood glucose levels to increase. Because of this, it is very important to take your insulin and diabetes medicines and eat some form of carbohydrate when you are sick. You should make a plan for days when you are sick (sick day plan) as part of your diabetes management plan. You and your health care provider should make this plan in advance. The following guidelines are intended to help you manage an illness that lasts for about 24 hours or less. Your health care provider may also give you more specific instructions. What do I need to do to manage my blood glucose?  Check your blood glucose every 2-4 hours, or as often as told by your health care provider.  Know your sick day treatment goals. Your target blood glucose levels may be different when you are sick.  If you use insulin, take your usual dose. ? If your blood glucose continues to be too high, you may need to take an additional insulin dose as told by your health care provider.  If you use oral diabetes medicine, you may need to stop taking it if you are not able to eat or drink normally. Ask your health care provider about whether you need to stop taking these medicines while you are sick.  If you use injectable hormone medicines other than insulin to control your diabetes, ask your health care provider about whether you need to stop taking these medicines while you are sick. What else can I do to manage my diabetes when I am sick? Check your ketones  If you have type 1 diabetes, check your urine ketones every 4 hours.  If you have type 2 diabetes, check your urine ketones as  often as told by your health care provider. Drink fluids  Drink enough fluid to keep your urine clear or pale yellow. This is especially important if you have a fever, vomiting, or diarrhea. Those symptoms can lead to dehydration.  Follow any instructions from your health care provider about beverages to avoid. ? Do not drink alcohol, caffeine, or drinks that contain a lot of sugar. Take medicines as directed  Take-over-the-counter and prescription medicines only as told by your health care provider.  Check medicine labels for added sugars. Some medicines may contain sugar or types of sugars that can raise your blood glucose level. What foods can I eat when I am sick? You need to eat some form of carbohydrates when you are sick. You should eat 45-50 grams (45-50 g) of carbohydrates every 3-4 hours until you feel better. All of the food choices below contain about 15 g of carbohydrates. Plan ahead and keep some of these foods around so you have them if you get sick.  4-6 oz (120-177 mL) carbonated beverage that contains sugar, such as regular (not diet) soda. You may be able to drink carbonated beverages more easily if you open the beverage and let it sit at room temperature for a few minutes before drinking.   of a twin frozen ice pop.  4 oz (120 g) regular gelatin.  4 oz (120 mL) fruit juice.  4 oz (120 g) ice cream or frozen yogurt.  2 oz (60  g) sherbet.  8 oz (240 mL) clear broth or soup.  4 oz (120 g) regular custard.  4 oz (120 g) regular pudding.  8 oz (240 g) plain yogurt.  1 slice bread or toast.  6 saltine crackers.  5 vanilla wafers.  Questions to ask your health care provider Consider asking the following questions so you know what to do on days when you are sick:  Should I adjust my diabetes medicines?  How often do I need to check my blood glucose?  What supplies do I need to manage my diabetes at home when I am sick?  What number can I call if I have  questions?  What foods and drinks should I avoid?  Contact a health care provider if:  You develop symptoms of diabetic ketoacidosis, such as: ? Fatigue. ? Weight loss. ? Excessive thirst. ? Light-headedness. ? Fruity or sweet-smelling breath. ? Excessive urination. ? Vision changes. ? Confusion or irritability. ? Nausea. ? Vomiting. ? Rapid breathing. ? Pain in the abdomen. ? Feeling flushed.  You are unable to drink fluids without vomiting.  You have any of the following for more than 6 hours: ? Nausea. ? Vomiting. ? Diarrhea.  Your blood glucose is at or above 240 mg/dL (13.3 mmol/L), even after you take an additional insulin dose.  You have a change in how you think, feel, or act (mental status).  You develop another serious illness.  You have been sick or have had a fever for 2 days or longer and you are not getting better. Get help right away if:  Your blood glucose is lower than 54 mg/dL (3.0 mmol/L).  You have difficulty breathing.  You have moderate or high ketone levels in your urine.  You used emergency glucagon to treat low blood glucose. Summary  Blood sugar (glucose) can be difficult to control when you are sick. Common illnesses that can cause problems for people with diabetes (diabetes mellitus) include colds, fever, flu (influenza), nausea, vomiting, and diarrhea.  Illnesses can cause stress and loss of body fluids (dehydration), and those issues can cause blood glucose levels to increase.  Make a plan for days when you are sick (sick day plan) as part of your diabetes management plan. You and your health care provider should make this plan in advance.  It is very important to take your insulin and diabetes medicines and to eat some form of carbohydrate when you are sick.  Contact your health care provider if have problems managing your blood glucose levels when you are sick, or if you have been sick or had a fever for 2 days or longer and are  not getting better. This information is not intended to replace advice given to you by your health care provider. Make sure you discuss any questions you have with your health care provider. Document Released: 04/17/2003 Document Revised: 01/11/2016 Document Reviewed: 01/11/2016 Elsevier Interactive Patient Education  2018 [employer].  Hyperglycemia Hyperglycemia is when the sugar (glucose) level in your blood is too high. It may not cause symptoms. If you do have symptoms, they may include warning signs, such as:  Feeling more thirsty than normal.  Hunger.  Feeling tired.  Needing to pee (urinate) more than normal.  Blurry eyesight (vision).  You may get other symptoms as it gets worse, such as:  Dry mouth.  Not being hungry (loss of appetite).  Fruity-smelling breath.  Weakness.  Weight gain or loss that is not planned. Weight loss may  be fast.  A tingling or numb feeling in your hands or feet.  Headache.  Skin that does not bounce back quickly when it is lightly pinched and released (poor skin turgor).  Pain in your belly (abdomen).  Cuts or bruises that heal slowly.  High blood sugar can happen to people who do or do not have diabetes. High blood sugar can happen slowly or quickly, and it can be an emergency. Follow these instructions at home: General instructions  Take over-the-counter and prescription medicines only as told by your doctor.  Do not use products that contain nicotine or tobacco, such as cigarettes and e-cigarettes. If you need help quitting, ask your doctor.  Limit alcohol intake to no more than 1 drink per day for nonpregnant women and 2 drinks per day for men. One drink equals 12 oz of beer, 5 oz of wine, or 1 oz of hard liquor.  Manage stress. If you need help with this, ask your doctor.  Keep all follow-up visits as told by your doctor. This is important. Eating and drinking  Stay at a healthy weight.  Exercise regularly, as told  by your doctor.  Drink enough fluid, especially when you: ? Exercise. ? Get sick. ? Are in hot temperatures.  Eat healthy foods, such as: ? Low-fat (lean) proteins. ? Complex carbs (complex carbohydrates), such as whole wheat bread or brown rice. ? Fresh fruits and vegetables. ? Low-fat dairy products. ? Healthy fats.  Drink enough fluid to keep your pee (urine) clear or pale yellow. If you have diabetes:  Make sure you know the symptoms of hyperglycemia.  Follow your diabetes management plan, as told by your doctor. Make sure you: ? Take insulin and medicines as told. ? Follow your exercise plan. ? Follow your meal plan. Eat on time. Do not skip meals. ? Check your blood sugar as often as told. Make sure to check before and after exercise. If you exercise longer or in a different way than you normally do, check your blood sugar more often. ? Follow your sick day plan whenever you cannot eat or drink normally. Make this plan ahead of time with your doctor.  Share your diabetes management plan with people in your workplace, school, and household.  Check your urine for ketones when you are ill and as told by your doctor.  Carry a card or wear jewelry that says that you have diabetes. Contact a doctor if:  Your blood sugar level is higher than 240 mg/dL (13.3 mmol/L) for 2 days in a row.  You have problems keeping your blood sugar in your target range.  High blood sugar happens often for you. Get help right away if:  You have trouble breathing.  You have a change in how you think, feel, or act (mental status).  You feel sick to your stomach (nauseous), and that feeling does not go away.  You cannot stop throwing up (vomiting). These symptoms may be an emergency. Do not wait to see if the symptoms will go away. Get medical help right away. Call your local emergency services (911 in the [place] Do not drive yourself to the hospital. Summary  Hyperglycemia is when the sugar  (glucose) level in your blood is too high.  High blood sugar can happen to people who do or do not have diabetes.  Make sure you drink enough fluids, eat healthy foods, and exercise regularly.  Contact your doctor if you have problems keeping your blood sugar in your  target range. This information is not intended to replace advice given to you by your health care provider. Make sure you discuss any questions you have with your health care provider. Document Released: 02/09/2009 Document Revised: 12/31/2015 Document Reviewed: 12/31/2015 Elsevier Interactive Patient Education  2017 [employer] Daily Diabetes Record Introduction Check your blood glucose (BG) as directed by your health care provider. Use this form to record your BG results as well as any diabetes medicines that you take, including insulin. Bringing a record of your BG results and a list of your current medicines to your health care provider is very helpful in managing your diabetes. These numbers help your health care provider to know whether your diabetes management plan needs to be changed. Patient name: ____________________________________ Week of ____________________ Daily BG results and diabetes medicines Date: _________  Breakfast - BG / Medicines: ________________ / __________________________________________________________  Lunch - BG / Medicines: ___________________ / __________________________________________________________  Dinner - BG / Medicines: __________________ / __________________________________________________________  Bedtime - BG / Medicines: ________________ / ___________________________________________________________  Date: _________  Breakfast - BG / Medicines: ________________ / __________________________________________________________  Lunch - BG / Medicines: ___________________ / __________________________________________________________  Dinner - BG / Medicines: __________________ /  __________________________________________________________  Bedtime - BG / Medicines: ________________ / ___________________________________________________________  Date: _________  Breakfast - BG / Medicines: ________________ / __________________________________________________________  Lunch - BG / Medicines: ___________________ / __________________________________________________________  Dinner - BG / Medicines: __________________ / __________________________________________________________  Bedtime - BG / Medicines: ________________ / ___________________________________________________________  Date: _________  Breakfast - BG / Medicines: ________________ / __________________________________________________________  Lunch - BG / Medicines: ___________________ / __________________________________________________________  Dinner - BG / Medicines: __________________ / __________________________________________________________  Bedtime - BG / Medicines: ________________ / ___________________________________________________________  Date: _________  Breakfast - BG / Medicines: ________________ / __________________________________________________________  Lunch - BG / Medicines: ___________________ / __________________________________________________________  Dinner - BG / Medicines: __________________ / __________________________________________________________  Bedtime - BG / Medicines: ________________ / ___________________________________________________________  Date: _________  Breakfast - BG / Medicines: ________________ / __________________________________________________________  Lunch - BG / Medicines: ___________________ / __________________________________________________________  Dinner - BG / Medicines: __________________ / __________________________________________________________  Bedtime - BG / Medicines: ________________ /  ___________________________________________________________  Date: _________  Breakfast - BG / Medicines: ________________ / __________________________________________________________  Lunch - BG / Medicines: ___________________ / __________________________________________________________  Dinner - BG / Medicines: __________________ / __________________________________________________________  Bedtime - BG / Medicines: ________________ / ___________________________________________________________  Notes: ______________________________________________________________________________________________________________________ This information is not intended to replace advice given to you by your health care provider. Make sure you discuss any questions you have with your health care provider. Document Released: 03/18/2004 Document Revised: 01/11/2016 Document Reviewed: 01/11/2016 Elsevier Interactive Patient Education  [employer].

## 2017-02-09 NOTE — Progress Notes (Signed)
PRENATAL VISIT NOTE  Subjective:  Meredith Harrington is a 31 y.o. G1P0000 at [redacted]w[redacted]d being seen today for ongoing prenatal care.  She is currently monitored for the following issues for this high-risk pregnancy and has Supervision of high risk pregnancy, antepartum; Chronic hypertension during pregnancy, antepartum; S/P cholecystectomy; DM (diabetes mellitus) (HCC); Medical non-compliance; Positive GBS test; Low vitamin D level; Hemoglobin C trait (HCC); [redacted] weeks gestation of pregnancy; and Hereditary disease in family possibly affecting fetus, fetus 1 on her problem list.  Patient reports no complaints.  Contractions: Not present. Vag. Bleeding: None.  Movement: Present. Denies leaking of fluid.   The following portions of the patient's history were reviewed and updated as appropriate: allergies, current medications, past family history, past medical history, past social history, past surgical history and problem list. Problem list updated.  Objective:   Vitals:   02/09/17 1355  BP: 121/73  Pulse: (!) 105  Weight: 255 lb 6.4 oz (115.8 kg)    Fetal Status: Fetal Heart Rate (bpm): 154; doppler Fundal Height: 30 cm Movement: Present     General:  Alert, oriented and cooperative. Patient is in no acute distress.  Skin: Skin is warm and dry. No rash noted.   Cardiovascular: Normal heart rate noted  Respiratory: Normal respiratory effort, no problems with respiration noted  Abdomen: Soft, gravid, appropriate for gestational age.  Pain/Pressure: Present     Pelvic: Cervical exam deferred        Extremities: Normal range of motion.  Edema: Trace  Mental Status:  Normal mood and affect. Normal behavior. Normal judgment and thought content.   Assessment and Plan:  Pregnancy: G1P0000 at [redacted]w[redacted]d  1. Supervision of high risk pregnancy, antepartum     Medical non-compliance has not had an appointment in the office for exam since 11/18/16.   - CBC - HIV antibody - RPR - Protein / creatinine ratio,  urine - Vitamin D (25 hydroxy)  2. Chronic hypertension during pregnancy, antepartum     Normotensive today, not on meds.     -CMP  3. Type 2 diabetes mellitus with complication, without long-term current use of insulin (HCC)  Per patient report all CBGs elevated in the 170's.       Had DM teaching 11/20/16.  Is taking metformin.  Does not have blood sugar logs.      Changed to Glyburide 5 mg at HS and 2.5 in AM per Dr. Alysia Penna.   - Hemoglobin A1c  - glyBURIDE (DIABETA) 2.5 MG tablet; Take 1 tablet (2.5 mg total) by mouth daily with breakfast.  Dispense: 30 tablet; Refill: 3 - glyBURIDE (DIABETA) 5 MG tablet; Take 1 tablet (5 mg total) by mouth at bedtime.  Dispense: 30 tablet; Refill: 5  4. Low vitamin D level     - Vitamin D, Ergocalciferol, (DRISDOL) 50000 units CAPS capsule; Take 1 capsule (50,000 Units total) by mouth every 7 (seven) days.  Dispense: 30 capsule; Refill: 2 - Vitamin D (25 hydroxy)  5. Positive GBS test    PCN for labor/delivery.   6. Medical non-compliance     Was not aware that she needed to be seen at this office  7. Pelvic pressure in pregnancy, antepartum, third trimester    - Elastic Bandages & Supports (COMFORT FIT MATERNITY SUPP LG) MISC; 1 Units by Does not apply route daily.  Dispense: 1 each; Refill: 0  8. Round ligament pain    - Elastic Bandages & Supports (COMFORT FIT MATERNITY SUPP LG) MISC; 1  Units by Does not apply route daily.  Dispense: 1 each; Refill: 0  9. Acute URI    - amoxicillin-clavulanate (AUGMENTIN) 875-125 MG tablet; Take 1 tablet by mouth 2 (two) times daily.  Dispense: 14 tablet; Refill: 0 - benzonatate (TESSALON PERLES) 100 MG capsule; Take 1 capsule (100 mg total) by mouth 3 (three) times daily as needed for cough.  Dispense: 30 capsule; Refill: 0 - fluconazole (DIFLUCAN) 150 MG tablet; Take 1 tablet (150 mg total) by mouth once. Repeat dose in 48-72 hours.  Dispense: 2 tablet; Refill: 0 - albuterol (PROVENTIL HFA;VENTOLIN HFA)  108 (90 Base) MCG/ACT inhaler; Inhale 2 puffs into the lungs every 6 (six) hours as needed for wheezing or shortness of breath.  Dispense: 18 g; Refill: 1  Preterm labor symptoms and general obstetric precautions including but not limited to vaginal bleeding, contractions, leaking of fluid and fetal movement were reviewed in detail with the patient. Please refer to After Visit Summary for other counseling recommendations.  Return in about 2 weeks (around 02/23/2017) for .Patient to bring in blood sugar logs with her next appointment.     Roe Coombs, CNM

## 2017-02-09 NOTE — Progress Notes (Signed)
Patient reports good fetal movement with pressure and back pain, denies contractions.

## 2017-02-10 ENCOUNTER — Other Ambulatory Visit: Payer: Self-pay | Admitting: Certified Nurse Midwife

## 2017-02-10 ENCOUNTER — Telehealth: Payer: Self-pay

## 2017-02-10 DIAGNOSIS — O99013 Anemia complicating pregnancy, third trimester: Secondary | ICD-10-CM

## 2017-02-10 DIAGNOSIS — R7989 Other specified abnormal findings of blood chemistry: Secondary | ICD-10-CM

## 2017-02-10 LAB — CBC
Hematocrit: 31 % — ABNORMAL LOW (ref 34.0–46.6)
Hemoglobin: 10.6 g/dL — ABNORMAL LOW (ref 11.1–15.9)
MCH: 28.6 pg (ref 26.6–33.0)
MCHC: 34.2 g/dL (ref 31.5–35.7)
MCV: 84 fL (ref 79–97)
Platelets: 371 10*3/uL (ref 150–379)
RBC: 3.71 x10E6/uL — ABNORMAL LOW (ref 3.77–5.28)
RDW: 14.6 % (ref 12.3–15.4)
WBC: 10.6 10*3/uL (ref 3.4–10.8)

## 2017-02-10 LAB — PROTEIN / CREATININE RATIO, URINE
Creatinine, Urine: 208.4 mg/dL
PROTEIN UR: 38.2 mg/dL
PROTEIN/CREAT RATIO: 183 mg/g{creat} (ref 0–200)

## 2017-02-10 LAB — RPR: RPR Ser Ql: NONREACTIVE

## 2017-02-10 LAB — COMPREHENSIVE METABOLIC PANEL
A/G RATIO: 1.2 (ref 1.2–2.2)
ALK PHOS: 99 IU/L (ref 39–117)
ALT: 15 IU/L (ref 0–32)
AST: 14 IU/L (ref 0–40)
Albumin: 3.8 g/dL (ref 3.5–5.5)
BILIRUBIN TOTAL: 0.2 mg/dL (ref 0.0–1.2)
BUN/Creatinine Ratio: 11 (ref 9–23)
BUN: 7 mg/dL (ref 6–20)
CHLORIDE: 99 mmol/L (ref 96–106)
CO2: 20 mmol/L (ref 20–29)
Calcium: 9.2 mg/dL (ref 8.7–10.2)
Creatinine, Ser: 0.63 mg/dL (ref 0.57–1.00)
GFR calc non Af Amer: 121 mL/min/{1.73_m2} (ref >59)
GFR, EST AFRICAN AMERICAN: 139 mL/min/{1.73_m2} (ref >59)
GLUCOSE: 258 mg/dL — AB (ref 65–99)
Globulin, Total: 3.2 g/dL (ref 1.5–4.5)
POTASSIUM: 4.1 mmol/L (ref 3.5–5.2)
Sodium: 136 mmol/L (ref 134–144)
Total Protein: 7 g/dL (ref 6.0–8.5)

## 2017-02-10 LAB — HEMOGLOBIN A1C
ESTIMATED AVERAGE GLUCOSE: 171 mg/dL
HEMOGLOBIN A1C: 7.6 % — AB (ref 4.8–5.6)

## 2017-02-10 LAB — VITAMIN D 25 HYDROXY (VIT D DEFICIENCY, FRACTURES): VIT D 25 HYDROXY: 19.1 ng/mL — AB (ref 30.0–100.0)

## 2017-02-10 LAB — HIV ANTIBODY (ROUTINE TESTING W REFLEX): HIV Screen 4th Generation wRfx: NONREACTIVE

## 2017-02-10 MED ORDER — CITRANATAL BLOOM 90-1 MG PO TABS: 1.0000 | ORAL_TABLET | Freq: Every day | ORAL | 12 refills | Status: DC

## 2017-02-10 MED ORDER — VITAMIN D (ERGOCALCIFEROL) 1.25 MG (50000 UNIT) PO CAPS: 50000.0000 [IU] | ORAL_CAPSULE | ORAL | 2 refills | Status: DC

## 2017-02-10 NOTE — Telephone Encounter (Signed)
Left message on VM to call office.

## 2017-02-10 NOTE — Telephone Encounter (Signed)
-----   Message from Roe Coombs, CNM sent at 02/10/2017 11:23 AM EDT ----- Please let her know that her random glucose: was 258.  And that her A1C: increased from 7.1 to 7.6.  She needs to be more compliant with taking her medications and checking her blood sugars please encourage her to bring her log: she will be seeing Dr. Earlene Plater.   Her vitamin D level is still low, I have refilled the vitamin D weekly for her as well.  Please also let her know that she is slightly anemic.  I have added bloom to her other PNV.  Thank you. R.Denney CNM

## 2017-02-10 NOTE — Progress Notes (Signed)
Random glucose: 258.  A1C: increased from 7.1 to 7.6.

## 2017-02-11 ENCOUNTER — Telehealth: Payer: Self-pay

## 2017-02-11 NOTE — Telephone Encounter (Signed)
Patient notified via My Chart

## 2017-02-11 NOTE — Progress Notes (Signed)
Patient notified via My Chart

## 2017-02-11 NOTE — Telephone Encounter (Signed)
Returned call to patient, left message on VM to check MyChart.

## 2017-02-11 NOTE — Telephone Encounter (Signed)
-----   Message from Roe Coombsachelle A Denney, CNM sent at 02/10/2017 11:23 AM EDT ----- Please let her know that her random glucose: was 258.  And that her A1C: increased from 7.1 to 7.6.  She needs to be more compliant with taking her medications and checking her blood sugars please encourage her to bring her log: she will be seeing Dr. Earlene Plateravis.   Her vitamin D level is still low, I have refilled the vitamin D weekly for her as well.  Please also let her know that she is slightly anemic.  I have added bloom to her other PNV.  Thank you. R.Denney CNM

## 2017-02-26 ENCOUNTER — Ambulatory Visit (INDEPENDENT_AMBULATORY_CARE_PROVIDER_SITE_OTHER): Payer: Medicaid Other | Admitting: Obstetrics and Gynecology

## 2017-02-26 VITALS — BP 128/86 | HR 93 | Wt 252.0 lb

## 2017-02-26 DIAGNOSIS — O0990 Supervision of high risk pregnancy, unspecified, unspecified trimester: Secondary | ICD-10-CM

## 2017-02-26 DIAGNOSIS — O10919 Unspecified pre-existing hypertension complicating pregnancy, unspecified trimester: Secondary | ICD-10-CM

## 2017-02-26 DIAGNOSIS — R7989 Other specified abnormal findings of blood chemistry: Secondary | ICD-10-CM

## 2017-02-26 DIAGNOSIS — E118 Type 2 diabetes mellitus with unspecified complications: Secondary | ICD-10-CM

## 2017-02-26 DIAGNOSIS — B951 Streptococcus, group B, as the cause of diseases classified elsewhere: Secondary | ICD-10-CM

## 2017-02-26 NOTE — Progress Notes (Signed)
PRENATAL VISIT NOTE  Subjective:  Meredith Harrington is a 31 y.o. G1P0000 at [redacted]w[redacted]d being seen today for ongoing prenatal care.  She is currently monitored for the following issues for this high-risk pregnancy and has Supervision of high risk pregnancy, antepartum; Chronic hypertension during pregnancy, antepartum; S/P cholecystectomy; DM (diabetes mellitus) (HCC); Medical non-compliance; Positive GBS test; Low vitamin D level; Hemoglobin C trait (HCC); [redacted] weeks gestation of pregnancy; and Hereditary disease in family possibly affecting fetus, fetus 1 on her problem list.  Patient reports no complaints.  Movement: Present. Denies leaking of fluid. Denies contractions.  T2DM Glyburide 2.5 Q am, 5 mg QHS FG: reports all are 170s,180s PP: 140s, but sometimes this is directly after meals  Patient reports she is not sure she is taking sugars at right time, does state she takes the fasting correctly and her fastings are always high. Reports she is taking her sugar at random times during the day otherwise, sometimes before meals, sometimes after. She takes it when she feels like her sugar is high and usually at those times, it is > 200.   The following portions of the patient's history were reviewed and updated as appropriate: allergies, current medications, past family history, past medical history, past social history, past surgical history and problem list. Problem list updated.  Objective:   Vitals:   02/26/17 1529 02/26/17 1550  BP: (!) 133/91 128/86  Pulse: 93   Weight: 252 lb (114.3 kg)     Fetal Status: Fetal Heart Rate (bpm): 150   Movement: Present     General:  Alert, oriented and cooperative. Patient is in no acute distress.  Skin: Skin is warm and dry. No rash noted.   Cardiovascular: Normal heart rate noted  Respiratory: Normal respiratory effort, no problems with respiration noted  Abdomen: Soft, gravid, appropriate for gestational age.        Pelvic: Cervical exam deferred         Extremities: Normal range of motion.     Mental Status:  Normal mood and affect. Normal behavior. Normal judgment and thought content.   Assessment and Plan:  Pregnancy: G1P0000 at [redacted]w[redacted]d  1. Chronic hypertension during pregnancy, antepartum No meds Moderately well controlled  2. Type 2 diabetes mellitus with complication, without long-term current use of insulin (HCC) Glyburide 2.5 Q am, increase to 7.5 mg QHS Reviewed that blood sugars are uncontrolled but it is difficult to assess how uncontrolled because she has not been compliant with checking blood sugar. She does report that her fasting sugars (whcih she describes taking correctly) are very elevated. Recommended she switch to insulin for blood glucose control bc she is so uncontrolled however patient reports she will have to be out of work if she is on insulin because her job won't let her drive on insulin. She requests to stay on glyburide for now. We reviewed the correct method to take blood glucose and strategies for keeping log including tracking on phone rather than paper. Will increase glyburide at night to 7.5 mg QHS and keep am dose same for now. She will check blood sugars regularly for a week and then come back to reassess and decide if further regiment adjustments are necessary or if she needs to be switched to insulin.   Fetal echo ordered today as well  3. Positive GBS test ppx in labor  4. Low vitamin D level Cont vit D  5. Supervision of high risk pregnancy, antepartum Patient has had irregular care in office  because she thought she did not have to come to office since she was getting regular US Reviewed that she should continue to present to office and that she will need further weekly testing starting shortly, she verbalizes understanding of the same  6. Asthma - albuterol prn, taking 3-4x/week  Preterm labor symptoms and general obstetric precautions including but not limited to vaginal bleeding,  contractions, leaking of fluid and fetal movement were reviewed in detail with the patient. Please refer to After Visit Summary for other counseling recommendations.  Return in about 1 week (around 03/05/2017) for OB visit (MD).   Conan BowensKelly M Carmina Walle, MD

## 2017-02-26 NOTE — Progress Notes (Signed)
Pt has glucose log with her today, thinks she is checking glucose at wrong times. Advised to review with provider today. Pt would like to discuss delivery plan, when she will deliver.

## 2017-02-26 NOTE — Patient Instructions (Addendum)
Continue to take your Glyburide 2.5 mg in the morning. At night, take 7.5 mg QHS (you may take your 5 mg and a 2.5 mg together)  Check your blood sugar first thing in the morning, then two hours after every meal. Make a note in your phone for each sugar level.   Diabetes Mellitus and Food It is important for you to manage your blood sugar (glucose) level. Your blood glucose level can be greatly affected by what you eat. Eating healthier foods in the appropriate amounts throughout the day at about the same time each day will help you control your blood glucose level. It can also help slow or prevent worsening of your diabetes mellitus. Healthy eating may even help you improve the level of your blood pressure and reach or maintain a healthy weight. General recommendations for healthful eating and cooking habits include:  Eating meals and snacks regularly. Avoid going long periods of time without eating to lose weight.  Eating a diet that consists mainly of plant-based foods, such as fruits, vegetables, nuts, legumes, and whole grains.  Using low-heat cooking methods, such as baking, instead of high-heat cooking methods, such as deep frying.  Work with your dietitian to make sure you understand how to use the Nutrition Facts information on food labels. How can food affect me? Carbohydrates Carbohydrates affect your blood glucose level more than any other type of food. Your dietitian will help you determine how many carbohydrates to eat at each meal and teach you how to count carbohydrates. Counting carbohydrates is important to keep your blood glucose at a healthy level, especially if you are using insulin or taking certain medicines for diabetes mellitus. Alcohol Alcohol can cause sudden decreases in blood glucose (hypoglycemia), especially if you use insulin or take certain medicines for diabetes mellitus. Hypoglycemia can be a life-threatening condition. Symptoms of hypoglycemia (sleepiness,  dizziness, and disorientation) are similar to symptoms of having too much alcohol. If your health care provider has given you approval to drink alcohol, do so in moderation and use the following guidelines:  Women should not have more than one drink per day, and men should not have more than two drinks per day. One drink is equal to: ? 12 oz of beer. ? 5 oz of wine. ? 1 oz of hard liquor.  Do not drink on an empty stomach.  Keep yourself hydrated. Have water, diet soda, or unsweetened iced tea.  Regular soda, juice, and other mixers might contain a lot of carbohydrates and should be counted.  What foods are not recommended? As you make food choices, it is important to remember that all foods are not the same. Some foods have fewer nutrients per serving than other foods, even though they might have the same number of calories or carbohydrates. It is difficult to get your body what it needs when you eat foods with fewer nutrients. Examples of foods that you should avoid that are high in calories and carbohydrates but low in nutrients include:  Trans fats (most processed foods list trans fats on the Nutrition Facts label).  Regular soda.  Juice.  Candy.  Sweets, such as cake, pie, doughnuts, and cookies.  Fried foods.  What foods can I eat? Eat nutrient-rich foods, which will nourish your body and keep you healthy. The food you should eat also will depend on several factors, including:  The calories you need.  The medicines you take.  Your weight.  Your blood glucose level.  Your blood pressure  level.  Your cholesterol level.  You should eat a variety of foods, including:  Protein. ? Lean cuts of meat. ? Proteins low in saturated fats, such as fish, egg whites, and beans. Avoid processed meats.  Fruits and vegetables. ? Fruits and vegetables that may help control blood glucose levels, such as apples, mangoes, and yams.  Dairy products. ? Choose fat-free or low-fat  dairy products, such as milk, yogurt, and cheese.  Grains, bread, pasta, and rice. ? Choose whole grain products, such as multigrain bread, whole oats, and brown rice. These foods may help control blood pressure.  Fats. ? Foods containing healthful fats, such as nuts, avocado, olive oil, canola oil, and fish.  Does everyone with diabetes mellitus have the same meal plan? Because every person with diabetes mellitus is different, there is not one meal plan that works for everyone. It is very important that you meet with a dietitian who will help you create a meal plan that is just right for you. This information is not intended to replace advice given to you by your health care provider. Make sure you discuss any questions you have with your health care provider. Document Released: 01/09/2005 Document Revised: 09/20/2015 Document Reviewed: 03/11/2013 Elsevier Interactive Patient Education  2017 [employer].

## 2017-03-04 ENCOUNTER — Encounter: Payer: Medicaid Other | Admitting: Obstetrics & Gynecology

## 2017-03-05 ENCOUNTER — Encounter (HOSPITAL_COMMUNITY): Payer: Self-pay

## 2017-03-05 ENCOUNTER — Other Ambulatory Visit (HOSPITAL_COMMUNITY): Payer: Self-pay | Admitting: Maternal and Fetal Medicine

## 2017-03-05 ENCOUNTER — Other Ambulatory Visit: Payer: Self-pay | Admitting: *Deleted

## 2017-03-05 ENCOUNTER — Ambulatory Visit (HOSPITAL_COMMUNITY)
Admission: RE | Admit: 2017-03-05 | Discharge: 2017-03-05 | Disposition: A | Discharge status: Home / Self Care | Payer: Medicaid Other | Source: Ambulatory Visit | Attending: Certified Nurse Midwife | Admitting: Certified Nurse Midwife

## 2017-03-05 ENCOUNTER — Ambulatory Visit (INDEPENDENT_AMBULATORY_CARE_PROVIDER_SITE_OTHER): Payer: Medicaid Other | Admitting: Obstetrics

## 2017-03-05 ENCOUNTER — Other Ambulatory Visit: Payer: Self-pay | Admitting: Obstetrics and Gynecology

## 2017-03-05 ENCOUNTER — Other Ambulatory Visit: Payer: Self-pay

## 2017-03-05 VITALS — BP 116/74 | HR 102 | Wt 255.0 lb

## 2017-03-05 DIAGNOSIS — E118 Type 2 diabetes mellitus with unspecified complications: Secondary | ICD-10-CM

## 2017-03-05 DIAGNOSIS — O0993 Supervision of high risk pregnancy, unspecified, third trimester: Secondary | ICD-10-CM

## 2017-03-05 DIAGNOSIS — O10013 Pre-existing essential hypertension complicating pregnancy, third trimester: Secondary | ICD-10-CM | POA: Diagnosis not present

## 2017-03-05 DIAGNOSIS — O0990 Supervision of high risk pregnancy, unspecified, unspecified trimester: Secondary | ICD-10-CM

## 2017-03-05 DIAGNOSIS — O10919 Unspecified pre-existing hypertension complicating pregnancy, unspecified trimester: Secondary | ICD-10-CM

## 2017-03-05 DIAGNOSIS — Z3A31 31 weeks gestation of pregnancy: Secondary | ICD-10-CM | POA: Insufficient documentation

## 2017-03-05 DIAGNOSIS — O10913 Unspecified pre-existing hypertension complicating pregnancy, third trimester: Secondary | ICD-10-CM

## 2017-03-05 DIAGNOSIS — O24113 Pre-existing diabetes mellitus, type 2, in pregnancy, third trimester: Secondary | ICD-10-CM | POA: Diagnosis present

## 2017-03-05 DIAGNOSIS — O3663X0 Maternal care for excessive fetal growth, third trimester, not applicable or unspecified: Secondary | ICD-10-CM

## 2017-03-05 DIAGNOSIS — E669 Obesity, unspecified: Secondary | ICD-10-CM | POA: Insufficient documentation

## 2017-03-05 DIAGNOSIS — Z7984 Long term (current) use of oral hypoglycemic drugs: Secondary | ICD-10-CM | POA: Diagnosis not present

## 2017-03-05 DIAGNOSIS — O403XX0 Polyhydramnios, third trimester, not applicable or unspecified: Secondary | ICD-10-CM

## 2017-03-05 DIAGNOSIS — R300 Dysuria: Secondary | ICD-10-CM

## 2017-03-05 DIAGNOSIS — O99213 Obesity complicating pregnancy, third trimester: Secondary | ICD-10-CM | POA: Diagnosis not present

## 2017-03-05 DIAGNOSIS — Z6841 Body Mass Index (BMI) 40.0 and over, adult: Secondary | ICD-10-CM | POA: Diagnosis not present

## 2017-03-05 DIAGNOSIS — O24313 Unspecified pre-existing diabetes mellitus in pregnancy, third trimester: Secondary | ICD-10-CM

## 2017-03-05 DIAGNOSIS — O3663X1 Maternal care for excessive fetal growth, third trimester, fetus 1: Secondary | ICD-10-CM

## 2017-03-05 MED ORDER — INSULIN SYRINGE-NEEDLE U-100 27G X 5/8" 1 ML MISC
5 refills | Status: DC
Start: 2017-03-05 — End: 2018-04-08

## 2017-03-05 MED ORDER — INSULIN NPH (HUMAN) (ISOPHANE) 100 UNIT/ML ~~LOC~~ SUSP
SUBCUTANEOUS | 3 refills | Status: DC
Start: 2017-03-05 — End: 2018-04-08

## 2017-03-05 MED ORDER — INSULIN REGULAR HUMAN 100 UNIT/ML IJ SOLN: INTRAMUSCULAR | 11 refills | Status: DC

## 2017-03-06 ENCOUNTER — Encounter: Payer: Self-pay | Admitting: Obstetrics

## 2017-03-06 DIAGNOSIS — O3663X1 Maternal care for excessive fetal growth, third trimester, fetus 1: Secondary | ICD-10-CM | POA: Insufficient documentation

## 2017-03-06 DIAGNOSIS — O403XX0 Polyhydramnios, third trimester, not applicable or unspecified: Secondary | ICD-10-CM | POA: Insufficient documentation

## 2017-03-06 NOTE — Progress Notes (Signed)
Subjective:  Meredith Harrington is a 31 y.o. G1P0000 at 3432w4d being seen today for ongoing prenatal care.  She is currently monitored for the following issues for this high-risk pregnancy and has Supervision of high risk pregnancy, antepartum; Chronic hypertension during pregnancy, antepartum; S/P cholecystectomy; DM (diabetes mellitus) (HCC); Medical non-compliance; Positive GBS test; Low vitamin D level; Hemoglobin C trait (HCC); [redacted] weeks gestation of pregnancy; and Hereditary disease in family possibly affecting fetus, fetus 1 on their problem list.  Patient reports no complaints.  Contractions: Not present. Vag. Bleeding: None.  Movement: Present. Denies leaking of fluid.   The following portions of the patient's history were reviewed and updated as appropriate: allergies, current medications, past family history, past medical history, past social history, past surgical history and problem list. Problem list updated.  Objective:   Vitals:   03/05/17 1618  BP: 116/74  Pulse: (!) 102  Weight: 255 lb (115.7 kg)    Fetal Status: Fetal Heart Rate (bpm): 150   Movement: Present     General:  Alert, oriented and cooperative. Patient is in no acute distress.  Skin: Skin is warm and dry. No rash noted.   Cardiovascular: Normal heart rate noted  Respiratory: Normal respiratory effort, no problems with respiration noted  Abdomen: Soft, gravid, appropriate for gestational age. Pain/Pressure: Present     Pelvic:  Cervical exam deferred        Extremities: Normal range of motion.  Edema: Trace  Mental Status: Normal mood and affect. Normal behavior. Normal judgment and thought content.   Urinalysis:      Assessment and Plan:  Pregnancy: G1P0000 at 3532w4d  1. Supervision of high risk pregnancy, antepartum  2. Type 2 diabetes mellitus with complication, without long-term current use of insulin (HCC) Rx: - failed oral hypoglycemic therapy.  D/C Glyburide. - insulin started  3. Dysuria Rx: -  Urine Culture  4. Chronic hypertension during pregnancy, antepartum - stable - start twice weekly fetal assessment at 32 weeks  5. LGA (large for gestational age) fetus affecting management of mother, third trimester, fetus 1 - repeat ultrasound for interval growth in 3-4 weeks  6. Polyhydramnios affecting pregnancy in third trimester - repeat ultrasound  In 3-4 weeks for AFI - start twice weekly fetal assessment at 32 weeks  Preterm labor symptoms and general obstetric precautions including but not limited to vaginal bleeding, contractions, leaking of fluid and fetal movement were reviewed in detail with the patient. Please refer to After Visit Summary for other counseling recommendations.  Return in about 1 week (around 03/12/2017) for ROB.   Brock BadHarper, Lamiyah Schlotter A, MD

## 2017-03-07 LAB — URINE CULTURE, OB REFLEX

## 2017-03-07 LAB — CULTURE, OB URINE

## 2017-03-12 ENCOUNTER — Institutional Professional Consult (permissible substitution): Payer: Self-pay | Admitting: Pediatrics

## 2017-03-12 ENCOUNTER — Ambulatory Visit: Payer: Medicaid Other

## 2017-03-12 ENCOUNTER — Ambulatory Visit (INDEPENDENT_AMBULATORY_CARE_PROVIDER_SITE_OTHER): Payer: Medicaid Other | Admitting: Obstetrics and Gynecology

## 2017-03-12 ENCOUNTER — Encounter: Payer: Self-pay | Admitting: *Deleted

## 2017-03-12 ENCOUNTER — Encounter: Payer: Self-pay | Admitting: Obstetrics and Gynecology

## 2017-03-12 VITALS — BP 134/93 | HR 100 | Wt 265.0 lb

## 2017-03-12 DIAGNOSIS — O10913 Unspecified pre-existing hypertension complicating pregnancy, third trimester: Secondary | ICD-10-CM | POA: Diagnosis not present

## 2017-03-12 DIAGNOSIS — O0993 Supervision of high risk pregnancy, unspecified, third trimester: Secondary | ICD-10-CM

## 2017-03-12 DIAGNOSIS — O0990 Supervision of high risk pregnancy, unspecified, unspecified trimester: Secondary | ICD-10-CM

## 2017-03-12 DIAGNOSIS — B951 Streptococcus, group B, as the cause of diseases classified elsewhere: Secondary | ICD-10-CM

## 2017-03-12 DIAGNOSIS — E118 Type 2 diabetes mellitus with unspecified complications: Secondary | ICD-10-CM

## 2017-03-12 DIAGNOSIS — O10919 Unspecified pre-existing hypertension complicating pregnancy, unspecified trimester: Secondary | ICD-10-CM

## 2017-03-12 NOTE — Progress Notes (Signed)
Pt voiced concern about insulin dose.

## 2017-03-12 NOTE — Progress Notes (Signed)
   PRENATAL VISIT NOTE  Subjective:  Meredith Harrington is a 31 y.o. G1P0000 at 1376w3d being seen today for ongoing prenatal care.  She is currently monitored for the following issues for this high-risk pregnancy and has Supervision of high risk pregnancy, antepartum; Chronic hypertension during pregnancy, antepartum; S/P cholecystectomy; DM (diabetes mellitus) (HCC); Medical non-compliance; Positive GBS test; Low vitamin D level; Hemoglobin C trait (HCC); [redacted] weeks gestation of pregnancy; Hereditary disease in family possibly affecting fetus, fetus 1; LGA (large for gestational age) fetus affecting management of mother, third trimester, fetus 1; and Polyhydramnios affecting pregnancy in third trimester on their problem list.  Patient reports no complaints.  Contractions: Not present. Vag. Bleeding: None.  Movement: Present. Denies leaking of fluid.   The following portions of the patient's history were reviewed and updated as appropriate: allergies, current medications, past family history, past medical history, past social history, past surgical history and problem list. Problem list updated.  Objective:   Vitals:   03/12/17 0937  BP: (!) 134/93  Pulse: 100  Weight: 265 lb (120.2 kg)    Fetal Status: Fetal Heart Rate (bpm): 146   Movement: Present     General:  Alert, oriented and cooperative. Patient is in no acute distress.  Skin: Skin is warm and dry. No rash noted.   Cardiovascular: Normal heart rate noted  Respiratory: Normal respiratory effort, no problems with respiration noted  Abdomen: Soft, gravid, appropriate for gestational age.  Pain/Pressure: Present     Pelvic: Cervical exam deferred        Extremities: Normal range of motion.  Edema: Mild pitting, slight indentation  Mental Status:  Normal mood and affect. Normal behavior. Normal judgment and thought content.   Assessment and Plan:  Pregnancy: G1P0000 at 7376w3d  1. Positive GBS test Will provide prophylaxis in  labor  2. Chronic hypertension during pregnancy, antepartum BP stable without meds.  Patient is not taking ASA - Fetal nonstress test- reviewed and reactive with baseline 140, mod variability + accels, no decels - US OB Limited - US MFM FETAL BPP WO NON STRESS; Future  3. Supervision of high risk pregnancy, antepartum Patient is doing well without complaints - US MFM FETAL BPP WO NON STRESS; Future  4. Type 2 diabetes mellitus with complication, without long-term current use of insulin (HCC) Patient started on insulin last week All fasting within range, after breakfast are running low and patient is overcompensating with food with pp values in the 130's with decrease am regular to 19 units All pp lunch and dinner elevated from 160's to 220. Will increase AM NPH 53 Discussed fetal macrosomia and borderline polyhydramnios on last ultrasound with associated maternal/fetal risks. Follow up growth ultrasound on 12/6 Plan for weekly NST with BPP  - Fetal nonstress test - US OB Limited - US MFM FETAL BPP WO NON STRESS; Future  Preterm labor symptoms and general obstetric precautions including but not limited to vaginal bleeding, contractions, leaking of fluid and fetal movement were reviewed in detail with the patient. Please refer to After Visit Summary for other counseling recommendations.  Return in about 1 week (around 03/19/2017) for ROB, NST, Faculty practice MD only.   Catalina AntiguaPeggy Alexus Michael, MD

## 2017-03-16 ENCOUNTER — Ambulatory Visit (INDEPENDENT_AMBULATORY_CARE_PROVIDER_SITE_OTHER): Payer: Medicaid Other | Admitting: Obstetrics and Gynecology

## 2017-03-16 ENCOUNTER — Encounter: Payer: Self-pay | Admitting: Obstetrics and Gynecology

## 2017-03-16 VITALS — BP 134/80 | HR 94 | Wt 268.5 lb

## 2017-03-16 DIAGNOSIS — O10919 Unspecified pre-existing hypertension complicating pregnancy, unspecified trimester: Secondary | ICD-10-CM

## 2017-03-16 DIAGNOSIS — E118 Type 2 diabetes mellitus with unspecified complications: Secondary | ICD-10-CM

## 2017-03-16 DIAGNOSIS — O0990 Supervision of high risk pregnancy, unspecified, unspecified trimester: Secondary | ICD-10-CM

## 2017-03-16 DIAGNOSIS — O403XX0 Polyhydramnios, third trimester, not applicable or unspecified: Secondary | ICD-10-CM

## 2017-03-16 DIAGNOSIS — O10913 Unspecified pre-existing hypertension complicating pregnancy, third trimester: Secondary | ICD-10-CM

## 2017-03-16 DIAGNOSIS — O0993 Supervision of high risk pregnancy, unspecified, third trimester: Secondary | ICD-10-CM

## 2017-03-16 NOTE — Progress Notes (Signed)
Patient reports good fetal movement with uterine irritability, denies bleeding.

## 2017-03-16 NOTE — Progress Notes (Signed)
   PRENATAL VISIT NOTE  Subjective:  Meredith Harrington is a 31 y.o. G1P0000 at 3166w0d being seen today for ongoing prenatal care.  She is currently monitored for the following issues for this high-risk pregnancy and has Supervision of high risk pregnancy, antepartum; Chronic hypertension during pregnancy, antepartum; S/P cholecystectomy; DM (diabetes mellitus) (HCC); Medical non-compliance; Positive GBS test; Low vitamin D level; Hemoglobin C trait (HCC); [redacted] weeks gestation of pregnancy; Hereditary disease in family possibly affecting fetus, fetus 1; LGA (large for gestational age) fetus affecting management of mother, third trimester, fetus 1; and Polyhydramnios affecting pregnancy in third trimester on their problem list.  Patient reports no complaints.  Contractions: Irritability. Vag. Bleeding: None.  Movement: Present. Denies leaking of fluid.   The following portions of the patient's history were reviewed and updated as appropriate: allergies, current medications, past family history, past medical history, past social history, past surgical history and problem list. Problem list updated.  Objective:   Vitals:   03/16/17 1534  BP: 134/80  Pulse: 94  Weight: 268 lb 8 oz (121.8 kg)    Fetal Status: Fetal Heart Rate (bpm): 138   Movement: Present     General:  Alert, oriented and cooperative. Patient is in no acute distress.  Skin: Skin is warm and dry. No rash noted.   Cardiovascular: Normal heart rate noted  Respiratory: Normal respiratory effort, no problems with respiration noted  Abdomen: Soft, gravid, appropriate for gestational age.  Pain/Pressure: Present     Pelvic: Cervical exam deferred        Extremities: Normal range of motion.  Edema: Mild pitting, slight indentation  Mental Status:  Normal mood and affect. Normal behavior. Normal judgment and thought content.   Assessment and Plan:  Pregnancy: G1P0000 at 2966w0d  1. Supervision of high risk pregnancy, antepartum Patient  is doing well without complaints  2. Polyhydramnios affecting pregnancy in third trimester BPP scheduled tomorrow  3. Type 2 diabetes mellitus with complication, without long-term current use of insulin (HCC) CBGs reviewed and significantly improved. Patient admits to over indulging this past week due to birthday parties and baby shower. One day, all values were normal. Encouraged patient to continue her efforts, particularly during thanksgiving season NST reviewed and baseline 140, mod variability, + accels, no decels  4. Chronic hypertension during pregnancy, antepartum Well controlled without meds Patient is not taking ASA  Preterm labor symptoms and general obstetric precautions including but not limited to vaginal bleeding, contractions, leaking of fluid and fetal movement were reviewed in detail with the patient. Please refer to After Visit Summary for other counseling recommendations.  Return in about 1 week (around 03/23/2017) for ROB, NST.   Catalina AntiguaPeggy Tesneem Dufrane, MD

## 2017-03-18 ENCOUNTER — Encounter: Payer: Self-pay | Admitting: Obstetrics & Gynecology

## 2017-03-20 ENCOUNTER — Encounter (HOSPITAL_COMMUNITY): Payer: Self-pay

## 2017-03-20 ENCOUNTER — Ambulatory Visit (HOSPITAL_COMMUNITY)
Admission: RE | Admit: 2017-03-20 | Discharge: 2017-03-20 | Disposition: A | Discharge status: Home / Self Care | Payer: Medicaid Other | Source: Ambulatory Visit | Attending: Obstetrics and Gynecology | Admitting: Obstetrics and Gynecology

## 2017-03-20 DIAGNOSIS — O0990 Supervision of high risk pregnancy, unspecified, unspecified trimester: Secondary | ICD-10-CM | POA: Diagnosis not present

## 2017-03-20 DIAGNOSIS — E118 Type 2 diabetes mellitus with unspecified complications: Secondary | ICD-10-CM | POA: Diagnosis present

## 2017-03-20 DIAGNOSIS — O10919 Unspecified pre-existing hypertension complicating pregnancy, unspecified trimester: Secondary | ICD-10-CM | POA: Diagnosis present

## 2017-03-20 DIAGNOSIS — Z3A33 33 weeks gestation of pregnancy: Secondary | ICD-10-CM | POA: Diagnosis not present

## 2017-03-20 NOTE — Addendum Note (Signed)
Encounter addended by: Melynda KellerVics, Posey Jasmin R, RDMS on: 03/20/2017 3:01 PM  Actions taken: Imaging Exam ended

## 2017-03-26 ENCOUNTER — Encounter: Payer: Self-pay | Admitting: Obstetrics and Gynecology

## 2017-03-26 ENCOUNTER — Ambulatory Visit (INDEPENDENT_AMBULATORY_CARE_PROVIDER_SITE_OTHER): Payer: Medicaid Other | Admitting: Obstetrics and Gynecology

## 2017-03-26 ENCOUNTER — Encounter (HOSPITAL_COMMUNITY): Payer: Self-pay

## 2017-03-26 ENCOUNTER — Other Ambulatory Visit (HOSPITAL_COMMUNITY)
Admission: RE | Admit: 2017-03-26 | Discharge: 2017-03-26 | Disposition: A | Discharge status: Home / Self Care | Payer: Medicaid Other | Source: Ambulatory Visit | Attending: Obstetrics and Gynecology | Admitting: Obstetrics and Gynecology

## 2017-03-26 ENCOUNTER — Ambulatory Visit (HOSPITAL_COMMUNITY)
Admission: RE | Admit: 2017-03-26 | Discharge: 2017-03-26 | Disposition: A | Discharge status: Home / Self Care | Payer: Medicaid Other | Source: Ambulatory Visit | Attending: Obstetrics and Gynecology | Admitting: Obstetrics and Gynecology

## 2017-03-26 ENCOUNTER — Other Ambulatory Visit: Payer: Self-pay | Admitting: Obstetrics and Gynecology

## 2017-03-26 ENCOUNTER — Ambulatory Visit (HOSPITAL_COMMUNITY): Admission: RE | Admit: 2017-03-26 | Payer: Medicaid Other | Source: Ambulatory Visit

## 2017-03-26 VITALS — BP 144/92 | HR 98 | Wt 263.0 lb

## 2017-03-26 DIAGNOSIS — O24113 Pre-existing diabetes mellitus, type 2, in pregnancy, third trimester: Secondary | ICD-10-CM | POA: Insufficient documentation

## 2017-03-26 DIAGNOSIS — Z3A34 34 weeks gestation of pregnancy: Secondary | ICD-10-CM

## 2017-03-26 DIAGNOSIS — E119 Type 2 diabetes mellitus without complications: Secondary | ICD-10-CM | POA: Insufficient documentation

## 2017-03-26 DIAGNOSIS — Z7982 Long term (current) use of aspirin: Secondary | ICD-10-CM | POA: Diagnosis not present

## 2017-03-26 DIAGNOSIS — E118 Type 2 diabetes mellitus with unspecified complications: Secondary | ICD-10-CM

## 2017-03-26 DIAGNOSIS — O10013 Pre-existing essential hypertension complicating pregnancy, third trimester: Secondary | ICD-10-CM | POA: Diagnosis not present

## 2017-03-26 DIAGNOSIS — O403XX0 Polyhydramnios, third trimester, not applicable or unspecified: Secondary | ICD-10-CM

## 2017-03-26 DIAGNOSIS — E669 Obesity, unspecified: Secondary | ICD-10-CM | POA: Diagnosis not present

## 2017-03-26 DIAGNOSIS — O10913 Unspecified pre-existing hypertension complicating pregnancy, third trimester: Secondary | ICD-10-CM

## 2017-03-26 DIAGNOSIS — O0990 Supervision of high risk pregnancy, unspecified, unspecified trimester: Secondary | ICD-10-CM

## 2017-03-26 DIAGNOSIS — O10919 Unspecified pre-existing hypertension complicating pregnancy, unspecified trimester: Secondary | ICD-10-CM

## 2017-03-26 DIAGNOSIS — O99213 Obesity complicating pregnancy, third trimester: Secondary | ICD-10-CM | POA: Diagnosis not present

## 2017-03-26 DIAGNOSIS — O0993 Supervision of high risk pregnancy, unspecified, third trimester: Secondary | ICD-10-CM | POA: Diagnosis present

## 2017-03-26 MED ORDER — VALACYCLOVIR HCL 500 MG PO TABS: 500.0000 mg | ORAL_TABLET | Freq: Two times a day (BID) | ORAL | 6 refills | Status: DC

## 2017-03-26 NOTE — Progress Notes (Signed)
Pt c/o: possible BV infection pt request exam. Right sided back pain and Rt side groin pain esp when lifting leg.  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

## 2017-03-26 NOTE — Progress Notes (Signed)
   PRENATAL VISIT NOTE  Subjective:  Meredith Harrington is a 31 y.o. G1P0000 at 7155w3d being seen today for ongoing prenatal care.  She is currently monitored for the following issues for this high-risk pregnancy and has Supervision of high risk pregnancy, antepartum; Chronic hypertension during pregnancy, antepartum; S/P cholecystectomy; DM (diabetes mellitus) (HCC); Medical non-compliance; Positive GBS test; Low vitamin D level; Hemoglobin C trait (HCC); [redacted] weeks gestation of pregnancy; Hereditary disease in family possibly affecting fetus, fetus 1; LGA (large for gestational age) fetus affecting management of mother, third trimester, fetus 1; and Polyhydramnios affecting pregnancy in third trimester on their problem list.  Patient reports right leg and groin pain.  Contractions: Irregular. Vag. Bleeding: None.  Movement: Present. Denies leaking of fluid.   The following portions of the patient's history were reviewed and updated as appropriate: allergies, current medications, past family history, past medical history, past social history, past surgical history and problem list. Problem list updated.  Objective:   Vitals:   03/26/17 1015  BP: (!) 144/92  Pulse: 98  Weight: 263 lb (119.3 kg)    Fetal Status: Fetal Heart Rate (bpm): 160 Fundal Height: 36 cm Movement: Present     General:  Alert, oriented and cooperative. Patient is in no acute distress.  Skin: Skin is warm and dry. No rash noted.   Cardiovascular: Normal heart rate noted  Respiratory: Normal respiratory effort, no problems with respiration noted  Abdomen: Soft, gravid, appropriate for gestational age.  Pain/Pressure: Present     Pelvic: Cervical exam deferred        Extremities: Normal range of motion.  Edema: Moderate pitting, indentation subsides rapidly  Mental Status:  Normal mood and affect. Normal behavior. Normal judgment and thought content.   Assessment and Plan:  Pregnancy: G1P0000 at 1455w3d  1. Supervision of  high risk pregnancy, antepartum Patient is doing well She reports the presence of a non-pruritic discharge with mild odor and desires to be tested  Advised patient to keep stretching as it will help with sciatic nerve pain Patient reports a past exposure to a partner infected with HSV. She has never had any outbreaks. Patient desires to start prophylaxis  2. Type 2 diabetes mellitus with complication, without long-term current use of insulin (HCC) Patient did not bring CBG log She reports fasting in the low 90's and pp in low 120's with pp as high as 138  Patient missed BPP this morning- rescheduled for 1:30pm today Patient needs to reschedule fetal echo  3. Chronic hypertension during pregnancy, antepartum No meds Continue ASA Monitor closely  Term labor symptoms and general obstetric precautions including but not limited to vaginal bleeding, contractions, leaking of fluid and fetal movement were reviewed in detail with the patient. Please refer to After Visit Summary for other counseling recommendations.  Return in about 1 week (around 04/02/2017) for ROB.   Catalina AntiguaPeggy Ryer Asato, MD

## 2017-03-27 LAB — CERVICOVAGINAL ANCILLARY ONLY
Bacterial vaginitis: NEGATIVE
Candida vaginitis: NEGATIVE
Chlamydia: NEGATIVE
Neisseria Gonorrhea: NEGATIVE
Trichomonas: NEGATIVE

## 2017-04-02 ENCOUNTER — Encounter (HOSPITAL_COMMUNITY): Payer: Self-pay

## 2017-04-02 ENCOUNTER — Encounter: Payer: Self-pay | Admitting: Obstetrics and Gynecology

## 2017-04-02 ENCOUNTER — Inpatient Hospital Stay (HOSPITAL_COMMUNITY)
Admission: AD | Admit: 2017-04-02 | Discharge: 2017-04-02 | Disposition: A | Discharge status: Other Acute Inpatient Hospital | Payer: Medicaid Other | Source: Ambulatory Visit | Attending: Family Medicine | Admitting: Family Medicine

## 2017-04-02 ENCOUNTER — Ambulatory Visit (INDEPENDENT_AMBULATORY_CARE_PROVIDER_SITE_OTHER): Payer: Medicaid Other | Admitting: Obstetrics and Gynecology

## 2017-04-02 ENCOUNTER — Ambulatory Visit (HOSPITAL_COMMUNITY)
Admission: RE | Admit: 2017-04-02 | Discharge: 2017-04-02 | Disposition: A | Discharge status: Home / Self Care | Payer: Medicaid Other | Source: Ambulatory Visit | Attending: Certified Nurse Midwife | Admitting: Certified Nurse Midwife

## 2017-04-02 VITALS — BP 149/84 | HR 91 | Wt 272.0 lb

## 2017-04-02 DIAGNOSIS — E119 Type 2 diabetes mellitus without complications: Secondary | ICD-10-CM | POA: Insufficient documentation

## 2017-04-02 DIAGNOSIS — O0993 Supervision of high risk pregnancy, unspecified, third trimester: Secondary | ICD-10-CM

## 2017-04-02 DIAGNOSIS — O10013 Pre-existing essential hypertension complicating pregnancy, third trimester: Secondary | ICD-10-CM | POA: Insufficient documentation

## 2017-04-02 DIAGNOSIS — O99213 Obesity complicating pregnancy, third trimester: Secondary | ICD-10-CM | POA: Insufficient documentation

## 2017-04-02 DIAGNOSIS — O24113 Pre-existing diabetes mellitus, type 2, in pregnancy, third trimester: Secondary | ICD-10-CM | POA: Insufficient documentation

## 2017-04-02 DIAGNOSIS — B951 Streptococcus, group B, as the cause of diseases classified elsewhere: Secondary | ICD-10-CM | POA: Diagnosis not present

## 2017-04-02 DIAGNOSIS — E118 Type 2 diabetes mellitus with unspecified complications: Secondary | ICD-10-CM

## 2017-04-02 DIAGNOSIS — R7989 Other specified abnormal findings of blood chemistry: Secondary | ICD-10-CM | POA: Diagnosis not present

## 2017-04-02 DIAGNOSIS — Z3A35 35 weeks gestation of pregnancy: Secondary | ICD-10-CM | POA: Insufficient documentation

## 2017-04-02 DIAGNOSIS — O3663X0 Maternal care for excessive fetal growth, third trimester, not applicable or unspecified: Secondary | ICD-10-CM | POA: Diagnosis not present

## 2017-04-02 DIAGNOSIS — O403XX0 Polyhydramnios, third trimester, not applicable or unspecified: Secondary | ICD-10-CM | POA: Insufficient documentation

## 2017-04-02 DIAGNOSIS — D582 Other hemoglobinopathies: Secondary | ICD-10-CM

## 2017-04-02 DIAGNOSIS — O10919 Unspecified pre-existing hypertension complicating pregnancy, unspecified trimester: Secondary | ICD-10-CM

## 2017-04-02 DIAGNOSIS — O113 Pre-existing hypertension with pre-eclampsia, third trimester: Secondary | ICD-10-CM | POA: Diagnosis not present

## 2017-04-02 DIAGNOSIS — Z794 Long term (current) use of insulin: Secondary | ICD-10-CM | POA: Diagnosis not present

## 2017-04-02 DIAGNOSIS — O10913 Unspecified pre-existing hypertension complicating pregnancy, third trimester: Secondary | ICD-10-CM | POA: Diagnosis not present

## 2017-04-02 DIAGNOSIS — R030 Elevated blood-pressure reading, without diagnosis of hypertension: Secondary | ICD-10-CM | POA: Diagnosis present

## 2017-04-02 DIAGNOSIS — Z7982 Long term (current) use of aspirin: Secondary | ICD-10-CM | POA: Diagnosis not present

## 2017-04-02 DIAGNOSIS — O0990 Supervision of high risk pregnancy, unspecified, unspecified trimester: Secondary | ICD-10-CM

## 2017-04-02 LAB — CBC
HCT: 28.4 % — ABNORMAL LOW (ref 36.0–46.0)
Hemoglobin: 9.9 g/dL — ABNORMAL LOW (ref 12.0–15.0)
MCH: 27.4 pg (ref 26.0–34.0)
MCHC: 34.9 g/dL (ref 30.0–36.0)
MCV: 78.7 fL (ref 78.0–100.0)
PLATELETS: 321 10*3/uL (ref 150–400)
RBC: 3.61 MIL/uL — AB (ref 3.87–5.11)
RDW: 15.4 % (ref 11.5–15.5)
WBC: 9.8 10*3/uL (ref 4.0–10.5)

## 2017-04-02 LAB — COMPREHENSIVE METABOLIC PANEL
ALBUMIN: 2.7 g/dL — AB (ref 3.5–5.0)
ALT: 11 U/L — AB (ref 14–54)
AST: 15 U/L (ref 15–41)
Alkaline Phosphatase: 166 U/L — ABNORMAL HIGH (ref 38–126)
Anion gap: 9 (ref 5–15)
BUN: 5 mg/dL — AB (ref 6–20)
CHLORIDE: 105 mmol/L (ref 101–111)
CO2: 22 mmol/L (ref 22–32)
CREATININE: 0.65 mg/dL (ref 0.44–1.00)
Calcium: 8.3 mg/dL — ABNORMAL LOW (ref 8.9–10.3)
GFR calc Af Amer: >60 mL/min (ref >60)
GLUCOSE: 124 mg/dL — AB (ref 65–99)
Potassium: 3.4 mmol/L — ABNORMAL LOW (ref 3.5–5.1)
SODIUM: 136 mmol/L (ref 135–145)
Total Bilirubin: 0.5 mg/dL (ref 0.3–1.2)
Total Protein: 6.7 g/dL (ref 6.5–8.1)

## 2017-04-02 LAB — PROTEIN / CREATININE RATIO, URINE
Creatinine, Urine: 69 mg/dL
Protein Creatinine Ratio: 0.13 mg/mg{Cre} (ref 0.00–0.15)
Total Protein, Urine: 9 mg/dL

## 2017-04-02 NOTE — Addendum Note (Signed)
Addended by: Natale MilchSTALLING, Ridhima Golberg D on: 04/02/2017 11:41 AM   Modules accepted: Orders

## 2017-04-02 NOTE — Progress Notes (Addendum)
   PRENATAL VISIT NOTE  Subjective:  Meredith Harrington is a 31 y.o. G1P0000 at 2113w3d being seen today for ongoing prenatal care.  She is currently monitored for the following issues for this high-risk pregnancy and has Supervision of high risk pregnancy, antepartum; Chronic hypertension during pregnancy, antepartum; S/P cholecystectomy; DM (diabetes mellitus) (HCC); Medical non-compliance; Positive GBS test; Low vitamin D level; Hemoglobin C trait (HCC); [redacted] weeks gestation of pregnancy; Hereditary disease in family possibly affecting fetus, fetus 1; LGA (large for gestational age) fetus affecting management of mother, third trimester, fetus 1; and Polyhydramnios affecting pregnancy in third trimester on their problem list.  Patient reports occasional contractions.  Contractions: Irregular. Vag. Bleeding: None.  Movement: Present. Denies leaking of fluid. Does report some increased discharge. Does report significant swelling in hands and feet this week that has not gone away. Terrible headache a couple of days ago that lasted all day and then finally relived with tylenol.  NPH 53/19 Regular 19/19 FG: 70-90 PP: 100s-130s  The following portions of the patient's history were reviewed and updated as appropriate: allergies, current medications, past family history, past medical history, past social history, past surgical history and problem list. Problem list updated.  Objective:   Vitals:   04/02/17 0855  BP: (!) 149/84  Pulse: 91  Weight: 272 lb (123.4 kg)    Fetal Status: Fetal Heart Rate (bpm): 147   Movement: Present     General:  Alert, oriented and cooperative. Patient is in no acute distress.  Skin: Skin is warm and dry. No rash noted.   Cardiovascular: Normal heart rate noted  Respiratory: Normal respiratory effort, no problems with respiration noted  Abdomen: Soft, gravid, appropriate for gestational age.  Pain/Pressure: Present     Pelvic: Cervical exam deferred          Extremities: Normal range of motion.  Edema: Moderate pitting, indentation subsides rapidly  Mental Status:  Normal mood and affect. Normal behavior. Normal judgment and thought content.   Assessment and Plan:  Pregnancy: G1P0000 at 6613w3d  1. Type 2 diabetes mellitus with complication, without long-term current use of insulin (HCC) Well controlled on insulin Not on baby aspirin, was told to take it but never started For growth/BPP today Weekly NST/BPP  2. Supervision of high risk pregnancy, antepartum  3. Positive GBS test ppx in labor  4. Low vitamin D level  5. Hemoglobin C trait (HCC)  6. Chronic hypertension during pregnancy, antepartum No meds Elevated BP today 160/107 -> 149/84 Significant swelling HA earlier this week now resolved  To US for BPP then MAU for PEC testing, patient verbalizes understanding  Preterm labor symptoms and general obstetric precautions including but not limited to vaginal bleeding, contractions, leaking of fluid and fetal movement were reviewed in detail with the patient. Please refer to After Visit Summary for other counseling recommendations.  Return in about 1 week (around 04/09/2017) for OB visit (MD).   Conan BowensKelly M Atlanta Pelto, MD

## 2017-04-02 NOTE — Progress Notes (Signed)
Patient reports good fetal movement with pressure and irregukar contractions.

## 2017-04-02 NOTE — MAU Note (Signed)
Urine in lab 

## 2017-04-02 NOTE — MAU Note (Signed)
Sent from MFM for elevated B/P. C/o mild H/A today . Has  Had blurred vision off and on.

## 2017-04-02 NOTE — MAU Provider Note (Signed)
Chief Complaint:  Hypertension   First Provider Initiated Contact with Patient 04/02/17 1219     HPI: Meredith Harrington is a 31 y.o. G1P0000 at 41w3dwho presents to maternity admissions reporting elevated blood pressure, excessive swelling in feet and face, new headache and blurred vision. She reports good fetal movement, denies LOF, vaginal bleeding, vaginal itching/burning, urinary symptoms, h/a, dizziness, n/v, diarrhea, constipation or fever/chills.    Pregnancy has been remarkable for chronic hypertension and Type 2 DM requiring Insulin.  Also has LGA and polyhydramnios  Hypertension  This is a recurrent problem. The current episode started today. The problem has been gradually worsening since onset. Associated symptoms include blurred vision, headaches and peripheral edema. Pertinent negatives include no anxiety, chest pain, palpitations or shortness of breath. There are no associated agents to hypertension. There are no known risk factors for coronary artery disease. Past treatments include nothing. There are no compliance problems.    Patient Active Problem List   Diagnosis Date Noted  . LGA (large for gestational age) fetus affecting management of mother, third trimester, fetus 1 03/06/2017  . Polyhydramnios affecting pregnancy in third trimester 03/06/2017  . [redacted] weeks gestation of pregnancy   . Hereditary disease in family possibly affecting fetus, fetus 1   . Positive GBS test 10/28/2016  . Low vitamin D level 10/28/2016  . 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 Supervision of high risk pregnancy, antepartum 10/21/2016  . Chronic hypertension during pregnancy, antepartum 10/21/2016  . S/P cholecystectomy 10/21/2016  . DM (diabetes mellitus) (HCC) 10/21/2016  . Medical non-compliance 10/21/2016    RN Note: Sent from MFM for elevated B/P. C/o mild H/A today . Has  Had blurred vision off and on    Past Medical History: Past Medical History:  Diagnosis Date  . Diabetes  mellitus without complication (HCC)   . Hypertension 05/2016  . Vaginal Pap smear, abnormal     Past obstetric history: OB History  Gravida Para Term Preterm AB Living  1 0 0 0 0 0  SAB TAB Ectopic Multiple Live Births  0 0 0 0      # Outcome Date GA Lbr Len/2nd Weight Sex Delivery Anes PTL Lv  1 Current               Past Surgical History: Past Surgical History:  Procedure Laterality Date  . CHOLECYSTECTOMY      Family History: Family History  Problem Relation Age of Onset  . Hypertension Mother 82  . Diabetes Mother 30  . Stroke Mother 22  . Hypertension Father   . Diabetes Father     Social History: Social History   Tobacco Use  . Smoking status: Never Smoker  . Smokeless tobacco: Never Used  Substance Use Topics  . Alcohol use: Yes    Comment: ocasional on holidays  . Drug use: Yes    Types: Marijuana    Comment: Not since pregnancy    Allergies:  Allergies  Allergen Reactions  . Shellfish Allergy Shortness Of Breath    Meds:  Medications Prior to Admission  Medication Sig Dispense Refill Last Dose  . ACCU-CHEK FASTCLIX LANCETS MISC 1 Device by Does not apply route 4 (four) times daily. 102 each 12 Taking  . albuterol (PROVENTIL HFA;VENTOLIN HFA) 108 (90 Base) MCG/ACT inhaler Inhale 2 puffs into the lungs every 6 (six) hours as needed for wheezing or shortness of breath. (Patient not taking: Reported on 04/02/2017) 18 g 1 Not Taking  . aspirin 81 MG  chewable tablet Chew 1 tablet (81 mg total) by mouth daily. (Patient not taking: Reported on 01/07/2017) 30 tablet 12 Not Taking  . benzonatate (TESSALON PERLES) 100 MG capsule Take 1 capsule (100 mg total) by mouth 3 (three) times daily as needed for cough. (Patient not taking: Reported on 03/16/2017) 30 capsule 0 Not Taking  . Elastic Bandages & Supports (COMFORT FIT MATERNITY SUPP LG) MISC 1 Units by Does not apply route daily. 1 each 0 Taking  . glucose blood (ACCU-CHEK GUIDE) test strip Check glucose  level fasting am and 2 hours after meals (Patient not taking: Reported on 04/02/2017) 100 each 12 Not Taking  . glyBURIDE (DIABETA) 2.5 MG tablet Take 1 tablet (2.5 mg total) by mouth daily with breakfast. 30 tablet 3 Taking  . glyBURIDE (DIABETA) 5 MG tablet Take 1 tablet (5 mg total) by mouth at bedtime. 30 tablet 5 Taking  . insulin NPH Human (HUMULIN N,NOVOLIN N) 100 UNIT/ML injection Take 51 units with breakfast and 19 units at bedtime 10 mL 3 Taking  . insulin regular (HUMULIN R) 100 units/mL injection Take 25 units with breakfast and 19 units with dinner 10 mL 11 Taking  . Insulin Syringe-Needle U-100 27G X 5/8" 1 ML MISC Use 1 syringe/needle to administer each dose of insulin 100 each 5 Taking  . Prenatal MV & Min w/FA-DHA (PRENATAL ADULT GUMMY/DHA/FA PO) Take by mouth.   Taking  . Prenatal-DSS-FeCb-FeGl-FA (CITRANATAL BLOOM) 90-1 MG TABS Take 1 tablet by mouth daily. 30 tablet 12 Taking  . triamcinolone ointment (KENALOG) 0.5 % Apply 1 application topically 2 (two) times daily. To breasts 90 g PRN Taking  . valACYclovir (VALTREX) 500 MG tablet Take 1 tablet (500 mg total) by mouth 2 (two) times daily. 60 tablet 6 Taking  . Vitamin D, Ergocalciferol, (DRISDOL) 50000 units CAPS capsule Take 1 capsule (50,000 Units total) by mouth every 7 (seven) days. 30 capsule 2 Taking    I have reviewed patient's Past Medical Hx, Surgical Hx, Family Hx, Social Hx, medications and allergies.   ROS:  Review of Systems  Constitutional: Negative for appetite change, chills and fever.  Eyes: Positive for blurred vision and visual disturbance.  Respiratory: Negative for shortness of breath.   Cardiovascular: Positive for leg swelling. Negative for chest pain and palpitations.  Gastrointestinal: Positive for abdominal pain (with palpation of liver). Negative for constipation, diarrhea, nausea and vomiting.  Genitourinary: Negative for dysuria, pelvic pain, vaginal bleeding and vaginal discharge.   Neurological: Positive for headaches. Negative for syncope and speech difficulty.   Other systems negative  Physical Exam   Patient Vitals for the past 24 hrs:  Temp Resp Height Weight  04/02/17 1216 98.3 F (36.8 C) 18 - -  04/02/17 1212 - - 5\' 6"  (1.676 m) 272 lb (123.4 kg)  141/98 143/89  147/91  Constitutional: Well-developed, well-nourished female in no acute distress.  Cardiovascular: normal rate and rhythm Respiratory: normal effort, clear to auscultation bilaterally GI: Abd soft, non-tender, gravid appropriate for gestational age.   No rebound or guarding. MS: Extremities nontender, 2+ pedal and pretibial edema, normal ROM Neurologic: Alert and oriented x 4.   DTRs 3+ with no clonus GU: Neg CVAT.  PELVIC EXAM:  deferred  FHT:  Baseline 140 , moderate variability, accelerations present, no decelerations Contractions:  Irregular     Labs: A/Positive/-- (06/26 1107) Results for orders placed or performed during the hospital encounter of 04/02/17 (from the past 24 hour(s))  Protein / creatinine ratio, urine  Status: None   Collection Time: 04/02/17 12:03 PM  Result Value Ref Range   Creatinine, Urine 69.00 mg/dL   Total Protein, Urine 9 mg/dL   Protein Creatinine Ratio 0.13 0.00 - 0.15 mg/mg[Cre]  CBC     Status: Abnormal   Collection Time: 04/02/17 12:39 PM  Result Value Ref Range   WBC 9.8 4.0 - 10.5 K/uL   RBC 3.61 (L) 3.87 - 5.11 MIL/uL   Hemoglobin 9.9 (L) 12.0 - 15.0 g/dL   HCT 08.628.4 (L) 57.836.0 - 46.946.0 %   MCV 78.7 78.0 - 100.0 fL   MCH 27.4 26.0 - 34.0 pg   MCHC 34.9 30.0 - 36.0 g/dL   RDW 62.915.4 52.811.5 - 41.315.5 %   Platelets 321 150 - 400 K/uL  Comprehensive metabolic panel     Status: Abnormal   Collection Time: 04/02/17 12:39 PM  Result Value Ref Range   Sodium 136 135 - 145 mmol/L   Potassium 3.4 (L) 3.5 - 5.1 mmol/L   Chloride 105 101 - 111 mmol/L   CO2 22 22 - 32 mmol/L   Glucose, Bld 124 (H) 65 - 99 mg/dL   BUN 5 (L) 6 - 20 mg/dL   Creatinine,  Ser 2.440.65 0.44 - 1.00 mg/dL   Calcium 8.3 (L) 8.9 - 10.3 mg/dL   Total Protein 6.7 6.5 - 8.1 g/dL   Albumin 2.7 (L) 3.5 - 5.0 g/dL   AST 15 15 - 41 U/L   ALT 11 (L) 14 - 54 U/L   Alkaline Phosphatase 166 (H) 38 - 126 U/L   Total Bilirubin 0.5 0.3 - 1.2 mg/dL   GFR calc non Af Amer >60 >60 mL/min   GFR calc Af Amer >60 >60 mL/min   Anion gap 9 5 - 15    Imaging:  [place] Ob Limited  Result Date: 03/12/2017 ----------------------------------------------------------------------  OBSTETRICS REPORT                      (Signed Final 03/12/2017 11:43 am) ---------------------------------------------------------------------- Patient Info  ID #:       [id number]                          D.O.B.:  [birth year] (30 yrs)  Name:       Meredith Harrington                 Visit Date: 03/12/2017 11:06 am ---------------------------------------------------------------------- Performed By  Performed By:     Lewayne Buntingyvona Gardner         Ref. Address:     [address]                    [address]                                                             [address]                                                             [place]  [address]  Attending:        Catalina Antigua MD      [facility]                                                             [facility]                                                             [facility]                                                             [place]  Referred By:      Roe Coombs CNM ---------------------------------------------------------------------- Orders   #  Description                                 Code   1  US OB LIMITED                               [id number]  ----------------------------------------------------------------------   #  Ordered By               Order #        Accession #    Episode #   1  PEGGY CONSTANT           [id number]      [id number]     [id number]   ---------------------------------------------------------------------- Indications   [redacted] weeks gestation of pregnancy                Z3A.32   Pre-existing essential hypertension            O10.013   complicating pregnancy, third trimester  ---------------------------------------------------------------------- OB History  Gravidity:    1         Term:   0        Prem:   0        SAB:   0  TOP:          0       Ectopic:  0        Living: 0 ---------------------------------------------------------------------- Fetal Evaluation  Num Of Fetuses:     1  Fetal Heart         157  Rate(bpm):  Cardiac Activity:   Observed  Presentation:       Cephalic  AFI Sum(cm)     %Tile       Largest Pocket(cm)  19.24           72          6.01  RUQ(cm)       RLQ(cm)  LUQ(cm)        LLQ(cm)  2.38          5.76          6.01           5.09 ---------------------------------------------------------------------- Gestational Age  LMP:           32w 3d        Date:  07/28/16                 EDD:   05/04/17  Best:          Armida Sans 3d     Det. By:  LMP  (07/28/16)          EDD:   05/04/17 ---------------------------------------------------------------------- Comments  Normal amniotic fluid volume. ---------------------------------------------------------------------- Impression  Viable fetus in cephalic presentation  Normal AFI ---------------------------------------------------------------------- Recommendations  Follow up growth ultrasound on 04/02/2017  Continue antenatal testing.  Recommend delivery by 39 weeks if maternal and fetal status  remain reassuring. ----------------------------------------------------------------------                 Catalina Antigua, MD Electronically Signed Final Report   03/12/2017 11:43 am ----------------------------------------------------------------------  [place] Mfm Fetal Bpp Wo Non Stress  Result Date: 03/26/2017 ----------------------------------------------------------------------  OBSTETRICS REPORT                       (Signed Final 03/26/2017 02:24 pm) ---------------------------------------------------------------------- Patient Info  ID #:       [id number]                          D.O.B.:  06/17/1985 (30 yrs)  Name:       Meredith Harrington                 Visit Date: 03/26/2017 01:35 pm ---------------------------------------------------------------------- Performed By  Performed By:     Percell Boston,         Ref. Address:     [address]                                                             [place]                                                             [address]  Attending:        Particia Nearing MD       Location:         [facility]  Referred By:      Rodell Perna  DEN[place] CNM ---------------------------------------------------------------------- Orders   #  Description                                 Code   1  [place] MFM FETAL BPP WO NON STRESS              76819.01  ----------------------------------------------------------------------   #  Ordered By               Order #        Accession #    Episode #   1  PEGGY CONSTANT           [id number]      [id number]     [id number]  ---------------------------------------------------------------------- Indications   [redacted] weeks gestation of pregnancy                Z3A.34   Hypertension - Chronic/Pre-existing            O10.019   Obesity complicating pregnancy, third          O99.213   trimester   Pre-existing diabetes, type 2, in pregnancy,   O24.113   third trimester on insulin  ---------------------------------------------------------------------- OB History  Gravidity:    1         Term:   0        Prem:   0        SAB:   0  TOP:          0       Ectopic:  0        Living: 0 ---------------------------------------------------------------------- Fetal Evaluation  Num Of Fetuses:     1  Fetal Heart         143  Rate(bpm):  Cardiac Activity:    Observed  Presentation:       Cephalic  Amniotic Fluid  AFI FV:      Subjectively upper-normal  AFI Sum(cm)     %Tile       Largest Pocket(cm)  26.56           96          8.09  RUQ(cm)       RLQ(cm)       LUQ(cm)        LLQ(cm)  6.89          6.39          5.19           8.09 ---------------------------------------------------------------------- Biophysical Evaluation  Amniotic F.V:   Within normal limits       F. Tone:        Observed  F. Movement:    Observed                   Score:          8/8  F. Breathing:   Observed ---------------------------------------------------------------------- Gestational Age  LMP:           34w 3d        Date:  07/28/16                 EDD:   05/04/17  Best:          34w 3d     Det. By:  LMP  (07/28/16)          EDD:   05/04/17 ---------------------------------------------------------------------- Impression  SIUP at 34+3 weeks  High normal amniotic  fluid volume  BPP 8/8 ---------------------------------------------------------------------- Recommendations  BPP and growth [place] next week ----------------------------------------------------------------------                 Particia Nearing, MD Electronically Signed Final Report   03/26/2017 02:24 pm ----------------------------------------------------------------------  [place] Mfm Fetal Bpp Wo Non Stress  Result Date: 03/20/2017 ----------------------------------------------------------------------  OBSTETRICS REPORT                      (Signed Final 03/20/2017 02:59 pm) ---------------------------------------------------------------------- Patient Info  ID #:       [id number]                          D.O.B.:  03/01/1986 (30 yrs)  Name:       Meredith Harrington                 Visit Date: 03/20/2017 02:29 pm ---------------------------------------------------------------------- Performed By  Performed By:     Tomma Lightning             Ref. Address:     [address]                    [facility]                                                              [address]                                                             [place]                                                             [address]  Attending:        Charlsie Merles MD         Location:         [facility]  Referred By:      Roe Coombs CNM ---------------------------------------------------------------------- Orders   #  Description                                 Code   1  [place] MFM FETAL BPP WO NON STRESS              [phone]  ----------------------------------------------------------------------   #  Ordered By               Order #        Accession #    Episode #   1  PEGGY CONSTANT           [id number]      [id number]     [id number]  ---------------------------------------------------------------------- Indications   [redacted] weeks  gestation of pregnancy                Z3A.33   Hypertension - Chronic/Pre-existing            O10.019   Obesity complicating pregnancy, third          O99.213   trimester   Pre-existing diabetes, type 2, in pregnancy,   O24.113   third trimester (glyburide)  ---------------------------------------------------------------------- OB History  Gravidity:    1         Term:   0        Prem:   0        SAB:   0  TOP:          0       Ectopic:  0        Living: 0 ---------------------------------------------------------------------- Fetal Evaluation  Num Of Fetuses:     1  Fetal Heart         161  Rate(bpm):  Cardiac Activity:   Observed  Presentation:       Cephalic  Placenta:           Posterior, above cervical os  Amniotic Fluid  AFI FV:      Subjectively within normal limits  AFI Sum(cm)     %Tile       Largest Pocket(cm)  15.5            55          5.44  RUQ(cm)       RLQ(cm)       LUQ(cm)        LLQ(cm)  1.57          5.44          3.37           5.12 ---------------------------------------------------------------------- Biophysical Evaluation  Amniotic F.V:   Within normal limits       F. Tone:        Observed  F. Movement:     Observed                   Score:          8/8  F. Breathing:   Observed ---------------------------------------------------------------------- Gestational Age  LMP:           33w 4d        Date:  07/28/16                 EDD:   05/04/17  Best:          33w 4d     Det. By:  LMP  (07/28/16)          EDD:   05/04/17 ---------------------------------------------------------------------- Anatomy  Stomach:               Appears normal, left   Bladder:                Appears normal                         sided  Kidneys:               Appear normal ---------------------------------------------------------------------- Impression  IUP at 33+4 weeks with [facility] and Trpe 2 DM; previous  scans showed polyhydramnios  Normal amniotic fluid with AFI 15.5 cm  BPP 8/8 ---------------------------------------------------------------------- Recommendations  Follow up growth ultrasound on 04/02/2017  Continue antenatal testing. ----------------------------------------------------------------------  Charlsie Merles, MD Electronically Signed Final Report   03/20/2017 02:59 pm ----------------------------------------------------------------------  [place] Mfm Fetal Bpp Wo Non Stress  Result Date: 03/05/2017 ----------------------------------------------------------------------  OBSTETRICS REPORT                      (Signed Final 03/05/2017 10:46 am) ---------------------------------------------------------------------- Patient Info  ID #:       [id number]                          D.O.B.:  Sep 20, 1985 (30 yrs)  Name:       Meredith Harrington                 Visit Date: 03/05/2017 09:54 am ---------------------------------------------------------------------- Performed By  Performed By:     Tommi Emery         Ref. Address:     [address]                                                             [place]                                                              [address]  Attending:        Charlsie Merles MD         Location:         [facility]  Referred By:      Roe Coombs CNM ---------------------------------------------------------------------- Orders   #  Description                                 Code   1  [place] MFM OB FOLLOW UP                         [id number]   2  [place] MFM FETAL BPP WO NON STRESS              76819.01  ----------------------------------------------------------------------   #  Ordered By               Order #        Accession #    Episode #   1  Particia Nearing            [id number]      [id number]     [id number]   2  KELLY DAVIS  [id number]      [id number]     [id number]  ---------------------------------------------------------------------- Indications   [redacted] weeks gestation of pregnancy                Z3A.31   Hypertension - Chronic/Pre-existing            O10.019   Obesity complicating pregnancy, third          O99.213   trimester   Pre-existing diabetes, type 2, in pregnancy,   O24.113   third trimester (glyburide)  ---------------------------------------------------------------------- OB History  Gravidity:    1         Term:   0        Prem:   0        SAB:   0  TOP:          0       Ectopic:  0        Living: 0 ---------------------------------------------------------------------- Fetal Evaluation  Num Of Fetuses:     1  Fetal Heart         167  Rate(bpm):  Cardiac Activity:   Observed  Presentation:       Cephalic  Placenta:           Posterior, above cervical os  P. Cord Insertion:  Previously Visualized  Amniotic Fluid  AFI FV:      Subjectively upper-normal  AFI Sum(cm)     %Tile       Largest Pocket(cm)  24.02           95          7.87  RUQ(cm)       RLQ(cm)       LUQ(cm)        LLQ(cm)  5.74          7.87          7.14           3.28 ---------------------------------------------------------------------- Biophysical Evaluation  Amniotic F.V:   Within normal limits       F.  Tone:        Observed  F. Movement:    Observed                   Score:          8/8  F. Breathing:   Observed ---------------------------------------------------------------------- Biometry  BPD:      81.1  mm     G. Age:  32w 4d         75  %    CI:        82.86   %    70 - 86                                                          FL/HC:      22.6   %    19.3 - 21.3  HC:       281   mm     G. Age:  30w 6d          7  %    HC/AC:      0.88        0.96 - 1.17  AC:      318.7  mm  G. Age:  35w 5d       > 97  %    FL/BPD:     78.2   %    71 - 87  FL:       63.4  mm     G. Age:  32w 6d         74  %    FL/AC:      19.9   %    20 - 24  HUM:      56.3  mm     G. Age:  32w 5d         77  %  CER:        39  mm     G. Age:  33w 0d         76  %  Est. FW:    2353  gm      5 lb 3 oz     89  % ---------------------------------------------------------------------- Gestational Age  LMP:           31w 3d        Date:  07/28/16                 EDD:   05/04/17  U/S Today:     33w 0d                                        EDD:   04/23/17  Best:          31w 3d     Det. By:  LMP  (07/28/16)          EDD:   05/04/17 ---------------------------------------------------------------------- Anatomy  Cranium:               Appears normal         Aortic Arch:            Previously seen  Cavum:                 Appears normal         Ductal Arch:            Previously seen  Ventricles:            Appears normal         Diaphragm:              Appears normal  Choroid Plexus:        Previously seen        Stomach:                Appears normal, left                                                                        sided  Cerebellum:            Previously seen        Abdomen:                Appears normal  Posterior Fossa:       Previously seen        Abdominal Wall:  Previously seen  Nuchal Fold:           Previously seen        Cord Vessels:           Previously seen  Face:                  Orbits and profile     Kidneys:                 Appear normal                         previously seen  Lips:                  Previously seen        Bladder:                Appears normal  Thoracic:              Appears normal         Spine:                  Previously seen  Heart:                 Appears normal         Upper Extremities:      Previously seen                         (4CH, axis, and situs  RVOT:                  Appears normal         Lower Extremities:      Previously seen  LVOT:                  Appears normal  Other:  Fetus appears to be a female. Heels previously seen. Technically          difficult due to fetal position. ---------------------------------------------------------------------- Cervix Uterus Adnexa  Cervix  Not visualized (advanced GA >29wks)  Uterus  No abnormality visualized.  Left Ovary  Not visualized.  Right Ovary  Size(cm)       3.7  x   2.2    x  1.7       Vol(ml): 7.2  Within normal limits.  Cul De Sac:   No free fluid seen.  Adnexa:       No abnormality visualized. ---------------------------------------------------------------------- Impression  SIUP at 31+3 weeks with type 2 DM and CHTN  Normal interval anatomy; anatomic survey complete  Amniotic fluid volume at the upper limit of normal with AFI 24  cm  Interval growth also at the upper limit of normal at the 89th  percentile; [facility] is at >97th percentile  BPP 8/8 ---------------------------------------------------------------------- Recommendations  Recommend follow-up ultrasound examination in 4 weeks  Encouraged to "tighten up" glycemic control due to near-  polyhydramnios and near-macrosomia ----------------------------------------------------------------------                 Charlsie Merles, MD Electronically Signed Final Report   03/05/2017 10:46 am ----------------------------------------------------------------------  [place] Mfm Ob Follow Up  Result Date: 03/05/2017 ----------------------------------------------------------------------  OBSTETRICS REPORT                       (Signed Final 03/05/2017 10:46 am) ---------------------------------------------------------------------- Patient Info  ID #:       [id number]  D.O.B.:  [birth year] (30 yrs)  Name:       Meredith Harrington                 Visit Date: 03/05/2017 09:54 am ---------------------------------------------------------------------- Performed By  Performed By:     Tommi Emery         Ref. Address:     [address]                                                             [place]                                                             [id number]  Attending:        Charlsie Merles MD         Location:         [facility]  Referred By:      Roe Coombs CNM ---------------------------------------------------------------------- Orders   #  Description                                 Code   1  [place] MFM OB FOLLOW UP                         [id number]   2  [place] MFM FETAL BPP WO NON STRESS              76819.01  ----------------------------------------------------------------------   #  Ordered By               Order #        Accession #    Episode #   1  Particia Nearing            [id number]      [id number]     [id number]   2  KELLY DAVIS              [id number]      [id number]     [id number]  ---------------------------------------------------------------------- Indications   [redacted] weeks gestation of pregnancy                Z3A.31   Hypertension - Chronic/Pre-existing            O10.019   Obesity complicating pregnancy, third          O99.213   trimester   Pre-existing diabetes, type 2, in pregnancy,  O24.113   third trimester (glyburide)  ---------------------------------------------------------------------- OB History  Gravidity:    1         Term:   0        Prem:   0        SAB:   0  TOP:          0       Ectopic:  0        Living: 0  ---------------------------------------------------------------------- Fetal Evaluation  Num Of Fetuses:     1  Fetal Heart         167  Rate(bpm):  Cardiac Activity:   Observed  Presentation:       Cephalic  Placenta:           Posterior, above cervical os  P. Cord Insertion:  Previously Visualized  Amniotic Fluid  AFI FV:      Subjectively upper-normal  AFI Sum(cm)     %Tile       Largest Pocket(cm)  24.02           95          7.87  RUQ(cm)       RLQ(cm)       LUQ(cm)        LLQ(cm)  5.74          7.87          7.14           3.28 ---------------------------------------------------------------------- Biophysical Evaluation  Amniotic F.V:   Within normal limits       F. Tone:        Observed  F. Movement:    Observed                   Score:          8/8  F. Breathing:   Observed ---------------------------------------------------------------------- Biometry  BPD:      81.1  mm     G. Age:  32w 4d         75  %    CI:        82.86   %    70 - 86                                                          FL/HC:      22.6   %    19.3 - 21.3  HC:       281   mm     G. Age:  30w 6d          7  %    HC/AC:      0.88        0.96 - 1.17  AC:      318.7  mm     G. Age:  35w 5d       > 97  %    FL/BPD:     78.2   %    71 - 87  FL:       63.4  mm     G. Age:  32w 6d         74  %    FL/AC:      19.9   %    20 - 24  HUM:      56.3  mm     G. Age:  32w 5d         77  %  CER:        39  mm     G. Age:  33w 0d         76  %  Est. FW:    2353  gm      5 lb 3 oz     89  % ---------------------------------------------------------------------- Gestational Age  LMP:           31w 3d        Date:  07/28/16                 EDD:   05/04/17  U/S Today:     33w 0d                                        EDD:   04/23/17  Best:          31w 3d     Det. By:  LMP  (07/28/16)          EDD:   05/04/17 ---------------------------------------------------------------------- Anatomy  Cranium:               Appears normal         Aortic Arch:             Previously seen  Cavum:                 Appears normal         Ductal Arch:            Previously seen  Ventricles:            Appears normal         Diaphragm:              Appears normal  Choroid Plexus:        Previously seen        Stomach:                Appears normal, left                                                                        sided  Cerebellum:            Previously seen        Abdomen:                Appears normal  Posterior Fossa:       Previously seen        Abdominal Wall:         Previously seen  Nuchal Fold:           Previously seen        Cord Vessels:           Previously seen  Face:                  Orbits and profile     Kidneys:  Appear normal                         previously seen  Lips:                  Previously seen        Bladder:                Appears normal  Thoracic:              Appears normal         Spine:                  Previously seen  Heart:                 Appears normal         Upper Extremities:      Previously seen                         (4CH, axis, and situs  RVOT:                  Appears normal         Lower Extremities:      Previously seen  LVOT:                  Appears normal  Other:  Fetus appears to be a female. Heels previously seen. Technically          difficult due to fetal position. ---------------------------------------------------------------------- Cervix Uterus Adnexa  Cervix  Not visualized (advanced GA >29wks)  Uterus  No abnormality visualized.  Left Ovary  Not visualized.  Right Ovary  Size(cm)       3.7  x   2.2    x  1.7       Vol(ml): 7.2  Within normal limits.  Cul De Sac:   No free fluid seen.  Adnexa:       No abnormality visualized. ---------------------------------------------------------------------- Impression  SIUP at 31+3 weeks with type 2 DM and CHTN  Normal interval anatomy; anatomic survey complete  Amniotic fluid volume at the upper limit of normal with AFI 24  cm  Interval growth also at the upper  limit of normal at the 89th  percentile; [facility] is at >97th percentile  BPP 8/8 ---------------------------------------------------------------------- Recommendations  Recommend follow-up ultrasound examination in 4 weeks  Encouraged to "tighten up" glycemic control due to near-  polyhydramnios and near-macrosomia ----------------------------------------------------------------------                 Charlsie Merles, MD Electronically Signed Final Report   03/05/2017 10:46 am ----------------------------------------------------------------------   MAU Course/MDM: I have ordered labs and reviewed results. Labs are within normal limits.   The patient has a new headache and blurred vision however which makes her severe preeclampsia NST reviewed and found to be reassuring Category I Consult Dr Shawnie Pons with presentation, exam findings and test results.  She recommends admission for induction of labor. Consulted NICU physician, who cannot accept admission due to high census Transfer arranged for Meredith Harrington with Dr Archie Patten accepting patient  Assessment: SIUP at [redacted]w[redacted]d Chronic hypertension with superimposed preeclampsia with severe features  Plan: Transfer to [facility] for delivery  Wynelle Bourgeois CNM, MSN Certified Nurse-Midwife 04/02/2017 12:19 PM

## 2017-04-02 NOTE — Addendum Note (Signed)
Addended by: Dalphine HandingGARDNER, Jury Caserta L on: 04/02/2017 11:19 AM   Modules accepted: Orders

## 2017-04-02 NOTE — ED Notes (Signed)
Pt seen today in [facility].  Report called to MAU.  Pt ambulated and signed into MAU for further evaluation per Dr. Jannifer Hickavis/Decker.

## 2017-04-02 NOTE — Addendum Note (Signed)
Addended by: Leroy LibmanAVIS, KELLY on: 04/02/2017 10:04 AM   Modules accepted: Orders

## 2017-04-07 ENCOUNTER — Encounter: Payer: Medicaid Other | Admitting: Obstetrics & Gynecology

## 2017-04-09 ENCOUNTER — Institutional Professional Consult (permissible substitution): Payer: Medicaid Other | Admitting: Pediatrics

## 2017-05-06 ENCOUNTER — Encounter: Payer: Self-pay | Admitting: Obstetrics and Gynecology

## 2017-05-06 ENCOUNTER — Ambulatory Visit (INDEPENDENT_AMBULATORY_CARE_PROVIDER_SITE_OTHER): Payer: Medicaid Other | Admitting: Obstetrics and Gynecology

## 2017-05-06 VITALS — BP 146/99 | HR 78 | Temp 98.6°F | Wt 246.0 lb

## 2017-05-06 DIAGNOSIS — Z1389 Encounter for screening for other disorder: Secondary | ICD-10-CM

## 2017-05-06 DIAGNOSIS — F53 Postpartum depression: Secondary | ICD-10-CM

## 2017-05-06 DIAGNOSIS — E118 Type 2 diabetes mellitus with unspecified complications: Secondary | ICD-10-CM

## 2017-05-06 DIAGNOSIS — O99345 Other mental disorders complicating the puerperium: Secondary | ICD-10-CM

## 2017-05-06 DIAGNOSIS — O10919 Unspecified pre-existing hypertension complicating pregnancy, unspecified trimester: Secondary | ICD-10-CM

## 2017-05-06 MED ORDER — AMLODIPINE BESYLATE 10 MG PO TABS: 10.0000 mg | ORAL_TABLET | Freq: Every day | ORAL | 1 refills | Status: AC

## 2017-05-06 NOTE — Patient Instructions (Addendum)
Health Maintenance, Female Adopting a healthy lifestyle and getting preventive care can go a long way to promote health and wellness. Talk with your health care provider about what schedule of regular examinations is right for you. This is a good chance for you to check in with your provider about disease prevention and staying healthy. In between checkups, there are plenty of things you can do on your own. Experts have done a lot of research about which lifestyle changes and preventive measures are most likely to keep you healthy. Ask your health care provider for more information. Weight and diet Eat a healthy diet  Be sure to include plenty of vegetables, fruits, low-fat dairy products, and lean protein.  Do not eat a lot of foods high in solid fats, added sugars, or salt.  Get regular exercise. This is one of the most important things you can do for your health. ? Most adults should exercise for at least 150 minutes each week. The exercise should increase your heart rate and make you sweat (moderate-intensity exercise). ? Most adults should also do strengthening exercises at least twice a week. This is in addition to the moderate-intensity exercise.  Maintain a healthy weight  Body mass index (BMI) is a measurement that can be used to identify possible weight problems. It estimates body fat based on height and weight. Your health care provider can help determine your BMI and help you achieve or maintain a healthy weight.  For females 20 years of age and older: ? A BMI below 18.5 is considered underweight. ? A BMI of 18.5 to 24.9 is normal. ? A BMI of 25 to 29.9 is considered overweight. ? A BMI of 30 and above is considered obese.  Watch levels of cholesterol and blood lipids  You should start having your blood tested for lipids and cholesterol at 32 years of age, then have this test every 5 years.  You may need to have your cholesterol levels checked more often if: ? Your lipid or  cholesterol levels are high. ? You are older than 32 years of age. ? You are at high risk for heart disease.  Cancer screening Lung Cancer  Lung cancer screening is recommended for adults 55-80 years old who are at high risk for lung cancer because of a history of smoking.  A yearly low-dose CT scan of the lungs is recommended for people who: ? Currently smoke. ? Have quit within the past 15 years. ? Have at least a 30-pack-year history of smoking. A pack year is smoking an average of one pack of cigarettes a day for 1 year.  Yearly screening should continue until it has been 15 years since you quit.  Yearly screening should stop if you develop a health problem that would prevent you from having lung cancer treatment.  Breast Cancer  Practice breast self-awareness. This means understanding how your breasts normally appear and feel.  It also means doing regular breast self-exams. Let your health care provider know about any changes, no matter how small.  If you are in your 20s or 30s, you should have a clinical breast exam (CBE) by a health care provider every 1-3 years as part of a regular health exam.  If you are 40 or older, have a CBE every year. Also consider having a breast X-ray (mammogram) every year.  If you have a family history of breast cancer, talk to your health care provider about genetic screening.  If you are at high risk   for breast cancer, talk to your health care provider about having an MRI and a mammogram every year.  Breast cancer gene (BRCA) assessment is recommended for women who have family members with BRCA-related cancers. BRCA-related cancers include: ? Breast. ? Ovarian. ? Tubal. ? Peritoneal cancers.  Results of the assessment will determine the need for genetic counseling and BRCA1 and BRCA2 testing.  Cervical Cancer Your health care provider may recommend that you be screened regularly for cancer of the pelvic organs (ovaries, uterus, and  vagina). This screening involves a pelvic examination, including checking for microscopic changes to the surface of your cervix (Pap test). You may be encouraged to have this screening done every 3 years, beginning at age 22.  For women ages 56-65, health care providers may recommend pelvic exams and Pap testing every 3 years, or they may recommend the Pap and pelvic exam, combined with testing for human papilloma virus (HPV), every 5 years. Some types of HPV increase your risk of cervical cancer. Testing for HPV may also be done on women of any age with unclear Pap test results.  Other health care providers may not recommend any screening for nonpregnant women who are considered low risk for pelvic cancer and who do not have symptoms. Ask your health care provider if a screening pelvic exam is right for you.  If you have had past treatment for cervical cancer or a condition that could lead to cancer, you need Pap tests and screening for cancer for at least 20 years after your treatment. If Pap tests have been discontinued, your risk factors (such as having a new sexual partner) need to be reassessed to determine if screening should resume. Some women have medical problems that increase the chance of getting cervical cancer. In these cases, your health care provider may recommend more frequent screening and Pap tests.  Colorectal Cancer  This type of cancer can be detected and often prevented.  Routine colorectal cancer screening usually begins at 32 years of age and continues through 32 years of age.  Your health care provider may recommend screening at an earlier age if you have risk factors for colon cancer.  Your health care provider may also recommend using home test kits to check for hidden blood in the stool.  A small camera at the end of a tube can be used to examine your colon directly (sigmoidoscopy or colonoscopy). This is done to check for the earliest forms of colorectal  cancer.  Routine screening usually begins at age 33.  Direct examination of the colon should be repeated every 5-10 years through 32 years of age. However, you may need to be screened more often if early forms of precancerous polyps or small growths are found.  Skin Cancer  Check your skin from head to toe regularly.  Tell your health care provider about any new moles or changes in moles, especially if there is a change in a mole's shape or color.  Also tell your health care provider if you have a mole that is larger than the size of a pencil eraser.  Always use sunscreen. Apply sunscreen liberally and repeatedly throughout the day.  Protect yourself by wearing long sleeves, pants, a wide-brimmed hat, and sunglasses whenever you are outside.  Heart disease, diabetes, and high blood pressure  High blood pressure causes heart disease and increases the risk of stroke. High blood pressure is more likely to develop in: ? People who have blood pressure in the high end of  the normal range (130-139/85-89 mm Hg). ? People who are overweight or obese. ? People who are African American.  If you are 21-29 years of age, have your blood pressure checked every 3-5 years. If you are 3 years of age or older, have your blood pressure checked every year. You should have your blood pressure measured twice-once when you are at a hospital or clinic, and once when you are not at a hospital or clinic. Record the average of the two measurements. To check your blood pressure when you are not at a hospital or clinic, you can use: ? An automated blood pressure machine at a pharmacy. ? A home blood pressure monitor.  If you are between 17 years and 37 years old, ask your health care provider if you should take aspirin to prevent strokes.  Have regular diabetes screenings. This involves taking a blood sample to check your fasting blood sugar level. ? If you are at a normal weight and have a low risk for diabetes,  have this test once every three years after 32 years of age. ? If you are overweight and have a high risk for diabetes, consider being tested at a younger age or more often. Preventing infection Hepatitis B  If you have a higher risk for hepatitis B, you should be screened for this virus. You are considered at high risk for hepatitis B if: ? You were born in a country where hepatitis B is common. Ask your health care provider which countries are considered high risk. ? Your parents were born in a high-risk country, and you have not been immunized against hepatitis B (hepatitis B vaccine). ? You have HIV or AIDS. ? You use needles to inject street drugs. ? You live with someone who has hepatitis B. ? You have had sex with someone who has hepatitis B. ? You get hemodialysis treatment. ? You take certain medicines for conditions, including cancer, organ transplantation, and autoimmune conditions.  Hepatitis C  Blood testing is recommended for: ? Everyone born from [birth year] through 1965. ? Anyone with known risk factors for hepatitis C.  Sexually transmitted infections (STIs)  You should be screened for sexually transmitted infections (STIs) including gonorrhea and chlamydia if: ? You are sexually active and are younger than 32 years of age. ? You are older than 32 years of age and your health care provider tells you that you are at risk for this type of infection. ? Your sexual activity has changed since you were last screened and you are at an increased risk for chlamydia or gonorrhea. Ask your health care provider if you are at risk.  If you do not have HIV, but are at risk, it may be recommended that you take a prescription medicine daily to prevent HIV infection. This is called pre-exposure prophylaxis (PrEP). You are considered at risk if: ? You are sexually active and do not regularly use condoms or know the HIV status of your partner(s). ? You take drugs by injection. ? You are  sexually active with a partner who has HIV.  Talk with your health care provider about whether you are at high risk of being infected with HIV. If you choose to begin PrEP, you should first be tested for HIV. You should then be tested every 3 months for as long as you are taking PrEP. Pregnancy  If you are premenopausal and you may become pregnant, ask your health care provider about preconception counseling.  If you may become  pregnant, take 400 to 800 micrograms (mcg) of folic acid every day.  If you want to prevent pregnancy, talk to your health care provider about birth control (contraception). Osteoporosis and menopause  Osteoporosis is a disease in which the bones lose minerals and strength with aging. This can result in serious bone fractures. Your risk for osteoporosis can be identified using a bone density scan.  If you are 65 years of age or older, or if you are at risk for osteoporosis and fractures, ask your health care provider if you should be screened.  Ask your health care provider whether you should take a calcium or vitamin D supplement to lower your risk for osteoporosis.  Menopause may have certain physical symptoms and risks.  Hormone replacement therapy may reduce some of these symptoms and risks. Talk to your health care provider about whether hormone replacement therapy is right for you. Follow these instructions at home:  Schedule regular health, dental, and eye exams.  Stay current with your immunizations.  Do not use any tobacco products including cigarettes, chewing tobacco, or electronic cigarettes.  If you are pregnant, do not drink alcohol.  If you are breastfeeding, limit how much and how often you drink alcohol.  Limit alcohol intake to no more than 1 drink per day for nonpregnant women. One drink equals 12 ounces of beer, 5 ounces of wine, or 1 ounces of hard liquor.  Do not use street drugs.  Do not share needles.  Ask your health care  provider for help if you need support or information about quitting drugs.  Tell your health care provider if you often feel depressed.  Tell your health care provider if you have ever been abused or do not feel safe at home. This information is not intended to replace advice given to you by your health care provider. Make sure you discuss any questions you have with your health care provider. Document Released: 10/28/2010 Document Revised: 09/20/2015 Document Reviewed: 01/16/2015 Elsevier Interactive Patient Education  2018 Else[employer]. Etonogestrel implant What is this medicine? ETONOGESTREL (et oh noe JES trel) is a contraceptive (birth control) device. It is used to prevent pregnancy. It can be used for up to 3 years. This medicine may be used for other purposes; ask your health care provider or pharmacist if you have questions. COMMON BRAND NAME(S): Implanon, Nexplanon What should I tell my health care provider before I take this medicine? They need to know if you have any of these conditions: -abnormal vaginal bleeding -blood vessel disease or blood clots -cancer of the breast, cervix, or liver -depression -diabetes -gallbladder disease -headaches -heart disease or recent heart attack -high blood pressure -high cholesterol -kidney disease -liver disease -renal disease -seizures -tobacco smoker -an unusual or allergic reaction to etonogestrel, other hormones, anesthetics or antiseptics, medicines, foods, dyes, or preservatives -pregnant or trying to get pregnant -breast-feeding How should I use this medicine? This device is inserted just under the skin on the inner side of your upper arm by a health care professional. Talk to your pediatrician regarding the use of this medicine in children. Special care may be needed. Overdosage: If you think you have taken too much of this medicine contact a poison control center or emergency room at once. NOTE: This medicine is only for  you. Do not share this medicine with others. What if I miss a dose? This does not apply. What may interact with this medicine? Do not take this medicine with any of the following   medications: -amprenavir -bosentan -fosamprenavir This medicine may also interact with the following medications: -barbiturate medicines for inducing sleep or treating seizures -certain medicines for fungal infections like ketoconazole and itraconazole -grapefruit juice -griseofulvin -medicines to treat seizures like carbamazepine, felbamate, oxcarbazepine, phenytoin, topiramate -modafinil -phenylbutazone -rifampin -rufinamide -some medicines to treat HIV infection like atazanavir, indinavir, lopinavir, nelfinavir, tipranavir, ritonavir -St. John's wort This list may not describe all possible interactions. Give your health care provider a list of all the medicines, herbs, non-prescription drugs, or dietary supplements you use. Also tell them if you smoke, drink alcohol, or use illegal drugs. Some items may interact with your medicine. What should I watch for while using this medicine? This product does not protect you against HIV infection (AIDS) or other sexually transmitted diseases. You should be able to feel the implant by pressing your fingertips over the skin where it was inserted. Contact your doctor if you cannot feel the implant, and use a non-hormonal birth control method (such as condoms) until your doctor confirms that the implant is in place. If you feel that the implant may have broken or become bent while in your arm, contact your healthcare provider. What side effects may I notice from receiving this medicine? Side effects that you should report to your doctor or health care professional as soon as possible: -allergic reactions like skin rash, itching or hives, swelling of the face, lips, or tongue -breast lumps -changes in emotions or moods -depressed mood -heavy or prolonged menstrual  bleeding -pain, irritation, swelling, or bruising at the insertion site -scar at site of insertion -signs of infection at the insertion site such as fever, and skin redness, pain or discharge -signs of pregnancy -signs and symptoms of a blood clot such as breathing problems; changes in vision; chest pain; severe, sudden headache; pain, swelling, warmth in the leg; trouble speaking; sudden numbness or weakness of the face, arm or leg -signs and symptoms of liver injury like dark yellow or brown urine; general ill feeling or flu-like symptoms; light-colored stools; loss of appetite; nausea; right upper belly pain; unusually weak or tired; yellowing of the eyes or skin -unusual vaginal bleeding, discharge -signs and symptoms of a stroke like changes in vision; confusion; trouble speaking or understanding; severe headaches; sudden numbness or weakness of the face, arm or leg; trouble walking; dizziness; loss of balance or coordination Side effects that usually do not require medical attention (report to your doctor or health care professional if they continue or are bothersome): -acne -back pain -breast pain -changes in weight -dizziness -general ill feeling or flu-like symptoms -headache -irregular menstrual bleeding -nausea -sore throat -vaginal irritation or inflammation This list may not describe all possible side effects. Call your doctor for medical advice about side effects. You may report side effects to FDA at 1-800-FDA-1088. Where should I keep my medicine? This drug is given in a hospital or clinic and will not be stored at home. NOTE: This sheet is a summary. It may not cover all possible information. If you have questions about this medicine, talk to your doctor, pharmacist, or health care provider.  2018 Elsevier/Gold Standard (2015-11-01 11:19:22)  

## 2017-05-06 NOTE — Progress Notes (Signed)
Post Partum Exam  Meredith Harrington is a 32 y.o. 601P0000 female who presents for a postpartum visit. She is 4 weeks postpartum following a low cervical transverse Cesarean section. I have fully reviewed the prenatal and intrapartum course. [facility]. [facility] Type 2 [facility]. Pt not taking BP medication of diabetic medication. Reports random CBG's have been in the 100's since discharge. Her depression scale score is increased. as well and reports thoughts of harming herself. Last @ 2 weeks ago. No plan or action taken.  The delivery was at 35.6 gestational weeks.  Anesthesia: epidural. Postpartum course has been ok per pt yet she states she believes still has pain off/on at incision site w/ activities.  Baby's course has been unremarkable. Baby is feeding by breast. Bleeding staining only. Bowel function is normal. Bladder function is normal. Patient is not sexually active. Contraception method is pt wants birth control that will allow her to continue to breast feed. . Postpartum depression screening:23 pt states on 2 occasions she has thought of harming herself.  The following portions of the patient's history were reviewed and updated as appropriate: allergies, current medications, past family history, past medical history, past social history and past surgical history.  Review of Systems Pertinent items are noted in HPI.    Objective:  Last menstrual period 07/28/2016.  General:  alert   Breasts:  not evaluated  Lungs: clear to auscultation bilaterally  Heart:  regular rate and rhythm, S1, S2 normal, no murmur, click, rub or gallop  Abdomen: soft, non-tender; bowel sounds normal; no masses,  no organomegaly, incision well healed   Vulva:  not evaluated  Vagina: not evaluated  Cervix:  not evlauated  Corpus: not examined  Adnexa:  not evaluated  Rectal Exam: Not performed.        Assessment:    Nl postpartum  exam. Pap smear 6/18 ( normal)  CHTN  Type DM   Postpartum depression  Plan:   1. Contraception: Nexplanon. Pt to schedule insertion 2. Will start Norvasc. Refer to PCP for management of her chronic medical problems. Continue to monitor BS and follow diet. [facility]. Pt declined depression medication. Instructed to go to the nearest ER for any S/H ideations 3. Follow up in: 1 year or as needed.

## 2017-05-14 ENCOUNTER — Ambulatory Visit (INDEPENDENT_AMBULATORY_CARE_PROVIDER_SITE_OTHER): Payer: Medicaid Other | Admitting: Obstetrics and Gynecology

## 2017-05-14 ENCOUNTER — Encounter: Payer: Self-pay | Admitting: *Deleted

## 2017-05-14 ENCOUNTER — Encounter: Payer: Self-pay | Admitting: Obstetrics and Gynecology

## 2017-05-14 VITALS — BP 138/84 | HR 94 | Wt 241.0 lb

## 2017-05-14 DIAGNOSIS — Z3049 Encounter for surveillance of other contraceptives: Secondary | ICD-10-CM | POA: Diagnosis not present

## 2017-05-14 DIAGNOSIS — Z30017 Encounter for initial prescription of implantable subdermal contraceptive: Secondary | ICD-10-CM

## 2017-05-14 DIAGNOSIS — Z3202 Encounter for pregnancy test, result negative: Secondary | ICD-10-CM

## 2017-05-14 LAB — POCT URINE PREGNANCY: Preg Test, Ur: NEGATIVE

## 2017-05-14 MED ORDER — ETONOGESTREL 68 MG ~~LOC~~ IMPL
68.0000 mg | DRUG_IMPLANT | Freq: Once | SUBCUTANEOUS | Status: AC
  Administered 2017-05-14: 68 mg via SUBCUTANEOUS

## 2017-05-14 NOTE — Progress Notes (Addendum)
     GYNECOLOGY OFFICE PROCEDURE NOTE  Meredith Harrington is a 32 y.o. G1P0000 here for Nexplanon insertion.  Last pap smear was on 09/2016 and was normal.  No other gynecologic concerns. Denies unprotected intercourse within the last 14 days. UPT: negative  Reviewed risks of insertion of implant including risk of infection, bleeding, damage to surrounding tissues and organs, migration of implant, difficult removal. She verbalizes understanding and affirms desire to proceed. Consent signed.   Nexplanon Insertion Procedure Patient identified, informed consent performed, consent signed.   Patient does understand that irregular bleeding is a very common side effect of this medication. She was advised to have backup contraception for one week after placement. Pregnancy test in clinic today was negative.  An adequate timeout was performed.  Patient's left arm was prepped and draped in the usual sterile fashion. The ruler used to measure and mark insertion area.  Patient was prepped hibacleans x3 and then injected with 3 ml of 1% lidocaine. Nexplanon removed from packaging using sterile technique. Device confirmed in needle, then inserted full length of needle and withdrawn per handbook instructions. Nexplanon was able to palpated in the patient's arm; patient palpated the insert herself. There was minimal blood loss.  Patient insertion site covered with guaze and a pressure bandage to reduce any bruising.  The patient tolerated the procedure well and was given post procedure instructions.   Device Info Exp: 09/2019 Lot#: [id number]  K. Therese SarahMeryl Dyesha Henault, M.D. Attending Obstetrician & Gynecologist, [facility], [facility]

## 2017-05-14 NOTE — Progress Notes (Signed)
RGYN patient presents for Nexplanon insertion today. LMP: pt states she had a period this month cannot recall what day.   Pt  Denies  any unprotected intercourse x 14 days  Office supplied

## 2017-06-01 ENCOUNTER — Telehealth: Payer: Self-pay

## 2017-06-01 NOTE — Telephone Encounter (Signed)
TC from pt requesting RTW letter for 06/08/17. Pt had PP on 05/06/17 and Nexplanon insertion on 05/14/17.  Ok for me to type letter? I do not see any reason in pt chart from last visit to delay this start.

## 2017-06-02 NOTE — Telephone Encounter (Signed)
Pt made aware letter to RTW will be up front

## 2017-06-19 ENCOUNTER — Encounter: Payer: Self-pay | Admitting: Obstetrics and Gynecology

## 2018-03-30 ENCOUNTER — Encounter (HOSPITAL_COMMUNITY): Payer: Self-pay

## 2018-03-30 ENCOUNTER — Emergency Department (HOSPITAL_COMMUNITY)
Admission: EM | Admit: 2018-03-30 | Discharge: 2018-03-30 | Disposition: A | Discharge status: Home / Self Care | Payer: No Typology Code available for payment source | Attending: Emergency Medicine | Admitting: Emergency Medicine

## 2018-03-30 DIAGNOSIS — Y92410 Unspecified street and highway as the place of occurrence of the external cause: Secondary | ICD-10-CM | POA: Diagnosis not present

## 2018-03-30 DIAGNOSIS — Y939 Activity, unspecified: Secondary | ICD-10-CM | POA: Diagnosis not present

## 2018-03-30 DIAGNOSIS — Y998 Other external cause status: Secondary | ICD-10-CM | POA: Insufficient documentation

## 2018-03-30 DIAGNOSIS — Z794 Long term (current) use of insulin: Secondary | ICD-10-CM | POA: Diagnosis not present

## 2018-03-30 DIAGNOSIS — I10 Essential (primary) hypertension: Secondary | ICD-10-CM | POA: Insufficient documentation

## 2018-03-30 DIAGNOSIS — E119 Type 2 diabetes mellitus without complications: Secondary | ICD-10-CM | POA: Insufficient documentation

## 2018-03-30 DIAGNOSIS — M791 Myalgia, unspecified site: Secondary | ICD-10-CM | POA: Insufficient documentation

## 2018-03-30 DIAGNOSIS — Z79899 Other long term (current) drug therapy: Secondary | ICD-10-CM | POA: Diagnosis not present

## 2018-03-30 DIAGNOSIS — M7918 Myalgia, other site: Secondary | ICD-10-CM

## 2018-03-30 DIAGNOSIS — R52 Pain, unspecified: Secondary | ICD-10-CM | POA: Diagnosis present

## 2018-03-30 MED ORDER — ACETAMINOPHEN 325 MG PO TABS
650.0000 mg | ORAL_TABLET | Freq: Once | ORAL | Status: AC
  Administered 2018-03-30: 650 mg via ORAL
  Filled 2018-03-30: qty 2
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
## 2018-03-30 NOTE — Discharge Instructions (Addendum)
Please read attached information. If you experience any new or worsening signs or symptoms please return to the emergency room for evaluation. Please follow-up with your primary care provider or specialist as discussed.  °

## 2018-04-01 ENCOUNTER — Emergency Department (HOSPITAL_COMMUNITY): Payer: No Typology Code available for payment source

## 2018-04-01 ENCOUNTER — Emergency Department (HOSPITAL_COMMUNITY)
Admission: EM | Admit: 2018-04-01 | Discharge: 2018-04-01 | Disposition: A | Discharge status: Home / Self Care | Payer: No Typology Code available for payment source | Attending: Emergency Medicine | Admitting: Emergency Medicine

## 2018-04-01 ENCOUNTER — Other Ambulatory Visit: Payer: Self-pay

## 2018-04-01 ENCOUNTER — Encounter (HOSPITAL_COMMUNITY): Payer: Self-pay | Admitting: *Deleted

## 2018-04-01 DIAGNOSIS — Z794 Long term (current) use of insulin: Secondary | ICD-10-CM | POA: Diagnosis not present

## 2018-04-01 DIAGNOSIS — M545 Low back pain: Secondary | ICD-10-CM | POA: Diagnosis not present

## 2018-04-01 DIAGNOSIS — R51 Headache: Secondary | ICD-10-CM | POA: Insufficient documentation

## 2018-04-01 DIAGNOSIS — E119 Type 2 diabetes mellitus without complications: Secondary | ICD-10-CM | POA: Insufficient documentation

## 2018-04-01 DIAGNOSIS — M4300 Spondylolysis, site unspecified: Secondary | ICD-10-CM | POA: Insufficient documentation

## 2018-04-01 DIAGNOSIS — Z79899 Other long term (current) drug therapy: Secondary | ICD-10-CM | POA: Diagnosis not present

## 2018-04-01 DIAGNOSIS — M7918 Myalgia, other site: Secondary | ICD-10-CM

## 2018-04-01 DIAGNOSIS — I10 Essential (primary) hypertension: Secondary | ICD-10-CM | POA: Diagnosis not present

## 2018-04-01 DIAGNOSIS — M542 Cervicalgia: Secondary | ICD-10-CM | POA: Insufficient documentation

## 2018-04-01 MED ORDER — CYCLOBENZAPRINE HCL 10 MG PO TABS: 10.0000 mg | ORAL_TABLET | Freq: Two times a day (BID) | ORAL | 0 refills | Status: DC | PRN

## 2018-04-01 MED ORDER — ACETAMINOPHEN 325 MG PO TABS
650.0000 mg | ORAL_TABLET | Freq: Once | ORAL | Status: AC
  Administered 2018-04-01: 650 mg via ORAL
  Filled 2018-04-01: qty 2

## 2018-04-01 NOTE — ED Notes (Signed)
Patient transported to CT 
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
## 2018-04-01 NOTE — Discharge Instructions (Addendum)
Please read the attached information.  As we discussed you need to discuss taking Flexeril with your healthcare provider to see if it is okay to take while breast-feeding.  Please return immediately if develop any new or worsening signs or symptoms.
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
## 2018-04-01 NOTE — ED Notes (Signed)
Patient transported to X-ray 

## 2018-04-08 ENCOUNTER — Other Ambulatory Visit: Payer: Self-pay

## 2018-04-08 ENCOUNTER — Emergency Department (HOSPITAL_COMMUNITY)
Admission: EM | Admit: 2018-04-08 | Discharge: 2018-04-08 | Disposition: A | Discharge status: Home / Self Care | Payer: No Typology Code available for payment source | Attending: Emergency Medicine | Admitting: Emergency Medicine

## 2018-04-08 ENCOUNTER — Encounter (HOSPITAL_COMMUNITY): Payer: Self-pay | Admitting: Emergency Medicine

## 2018-04-08 ENCOUNTER — Emergency Department (HOSPITAL_COMMUNITY): Payer: No Typology Code available for payment source

## 2018-04-08 DIAGNOSIS — Z79899 Other long term (current) drug therapy: Secondary | ICD-10-CM | POA: Insufficient documentation

## 2018-04-08 DIAGNOSIS — Y999 Unspecified external cause status: Secondary | ICD-10-CM | POA: Diagnosis not present

## 2018-04-08 DIAGNOSIS — Y9389 Activity, other specified: Secondary | ICD-10-CM | POA: Insufficient documentation

## 2018-04-08 DIAGNOSIS — Y92410 Unspecified street and highway as the place of occurrence of the external cause: Secondary | ICD-10-CM | POA: Insufficient documentation

## 2018-04-08 DIAGNOSIS — I10 Essential (primary) hypertension: Secondary | ICD-10-CM | POA: Diagnosis not present

## 2018-04-08 DIAGNOSIS — E119 Type 2 diabetes mellitus without complications: Secondary | ICD-10-CM | POA: Insufficient documentation

## 2018-04-08 DIAGNOSIS — F0781 Postconcussional syndrome: Secondary | ICD-10-CM | POA: Diagnosis not present

## 2018-04-08 DIAGNOSIS — S134XXS Sprain of ligaments of cervical spine, sequela: Secondary | ICD-10-CM | POA: Insufficient documentation

## 2018-04-08 DIAGNOSIS — Z794 Long term (current) use of insulin: Secondary | ICD-10-CM | POA: Diagnosis not present

## 2018-04-08 DIAGNOSIS — S3992XA Unspecified injury of lower back, initial encounter: Secondary | ICD-10-CM | POA: Diagnosis present

## 2018-04-08 DIAGNOSIS — Z7982 Long term (current) use of aspirin: Secondary | ICD-10-CM | POA: Insufficient documentation

## 2018-04-08 IMAGING — US US MFM OB FOLLOW-UP
1 series · 13 of 28 positions shown · non-contrast
Comparison: none

[Series 1: us mfm ob follow-up · 39 acquisitions, 13 frames shown]
[im 2/39]
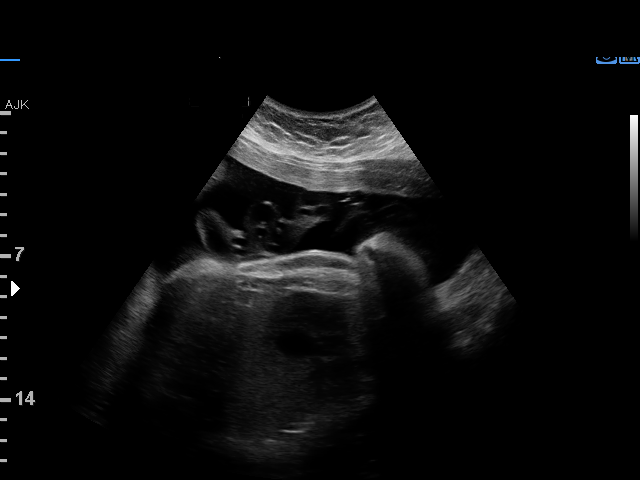
[im 5/39]
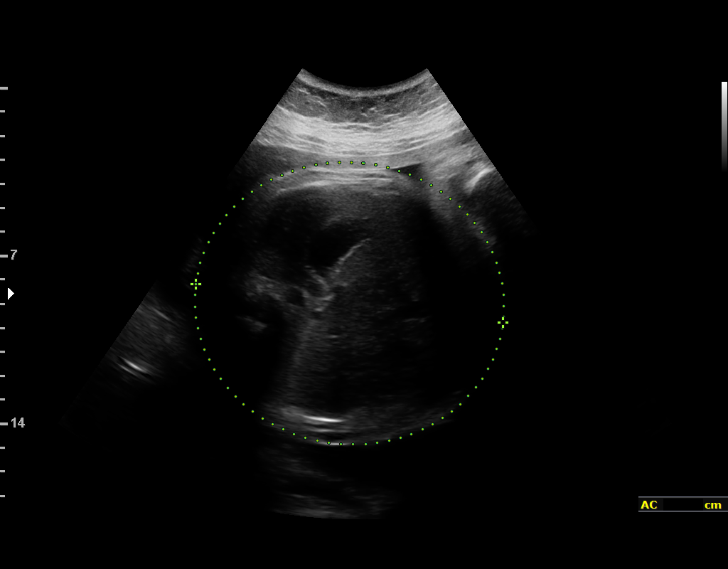
[im 8/39]
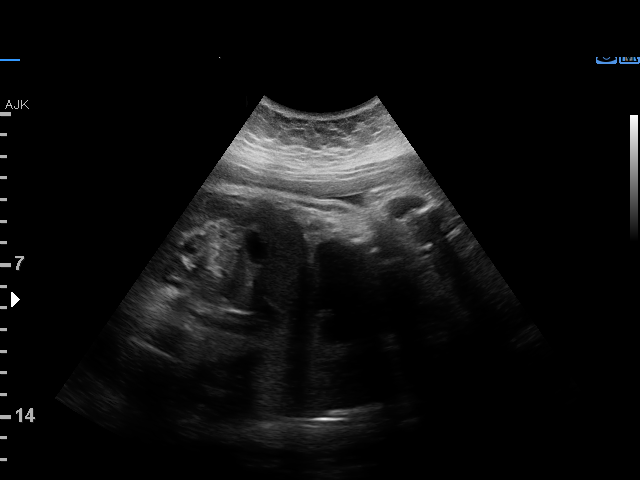
[im 10/39]
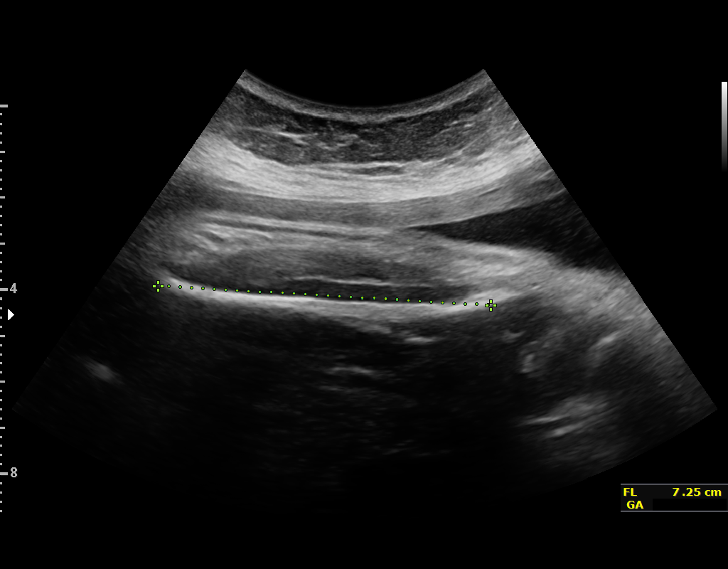
[im 13/39]
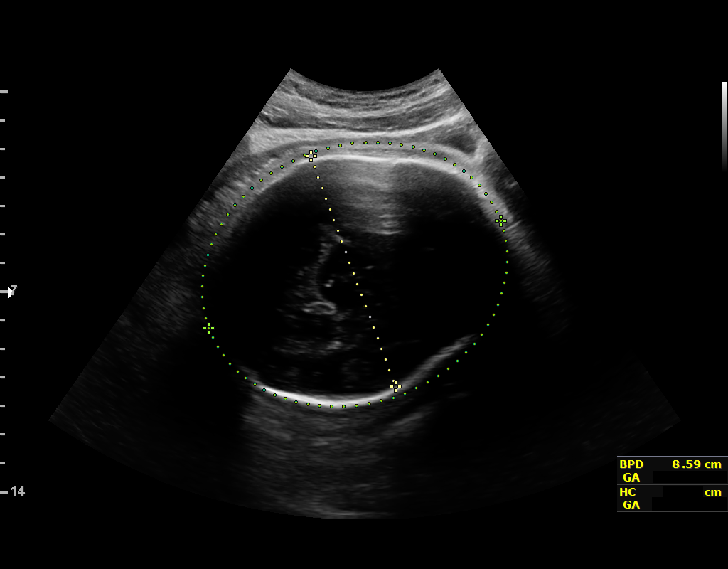
[im 16/39]
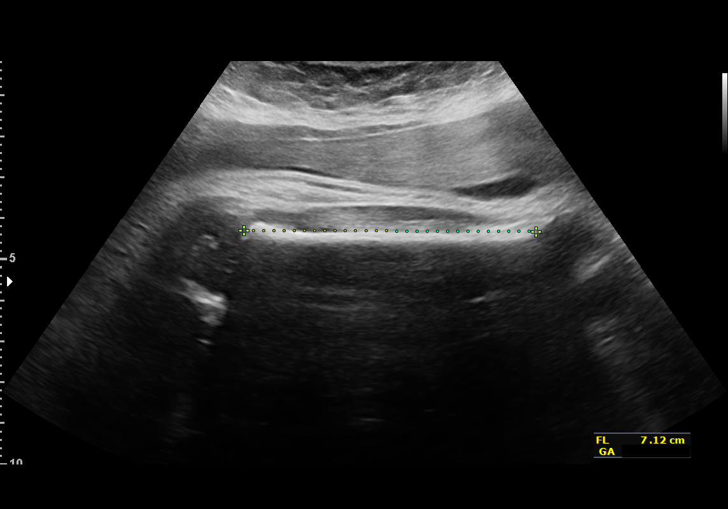
[im 20/39]
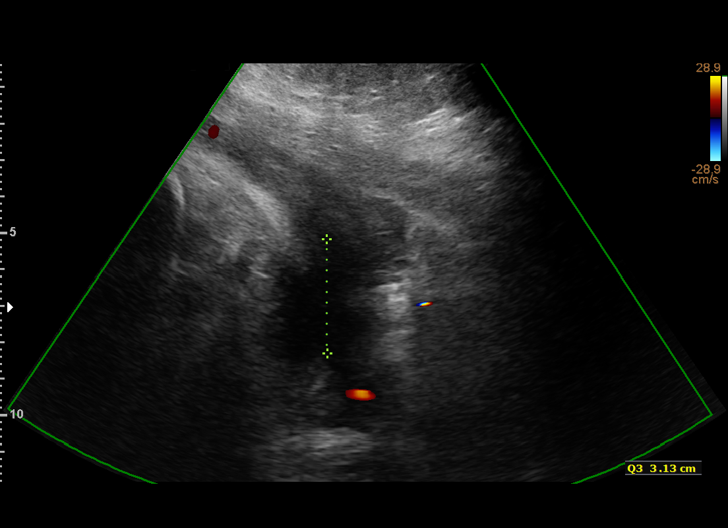
[im 23/39]
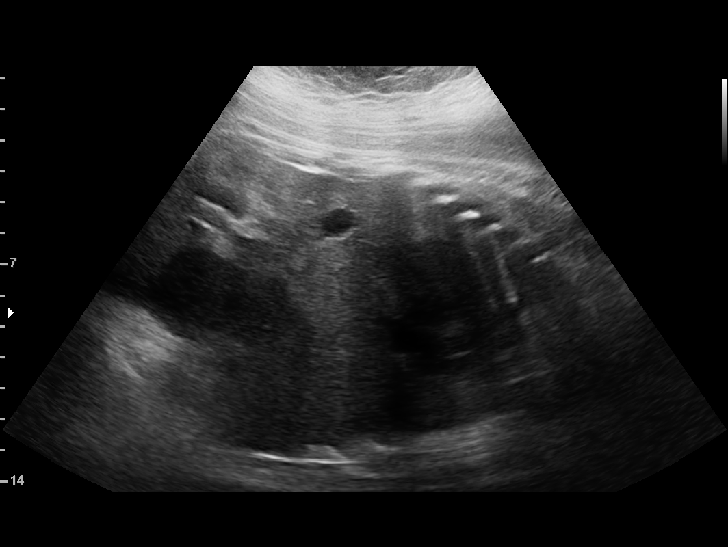
[im 26/39]
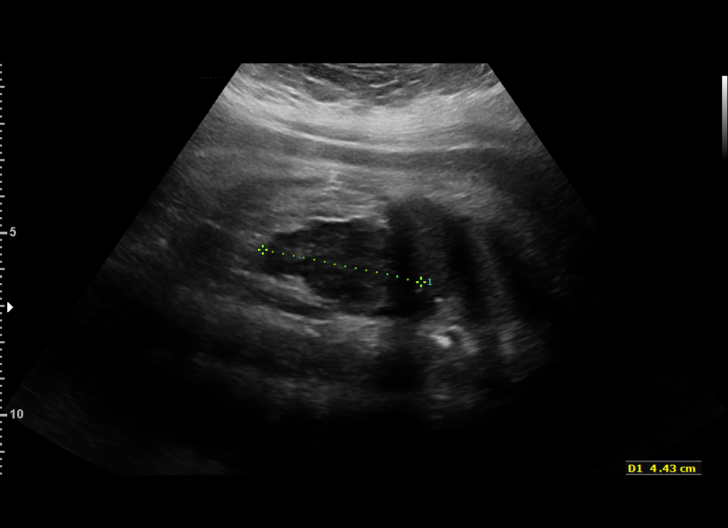
[im 29/39]
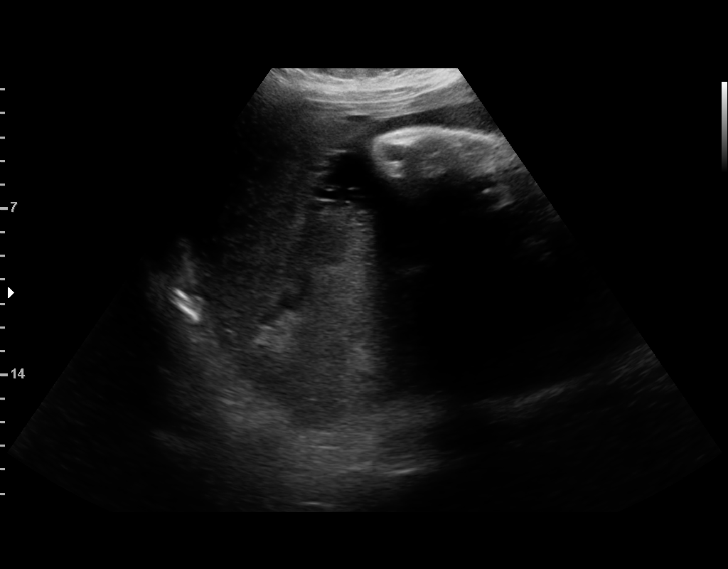
[im 31/39]
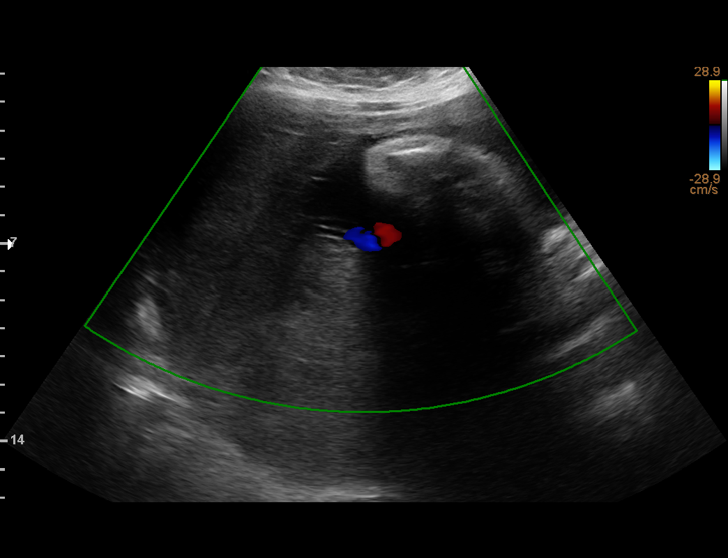
[im 34/39]
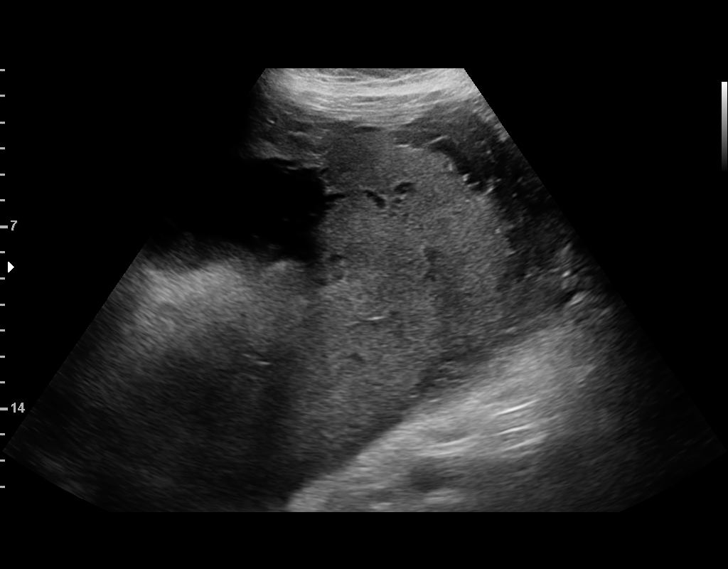
[im 37/39]
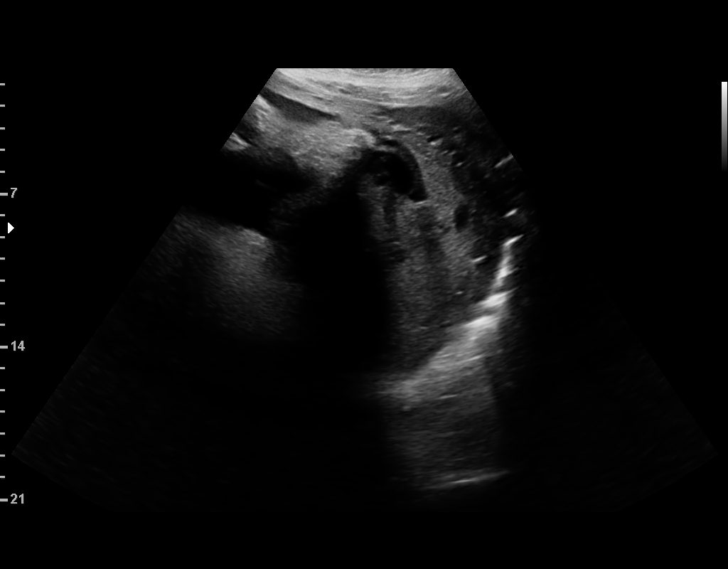

[13 of 28 positions shown; findings below may reference images not displayed]

Road [HOSPITAL]

1  [facility]AC[facility]           [id number]      [id number]     [id number]
2  [facility]            [id number]      [id number]     [id number]
Indications

35 weeks gestation of pregnancy
Hypertension - Chronic/Pre-existing
Obesity complicating pregnancy, third
trimester
Pre-existing diabetes, type 2, in pregnancy,
third trimester on insulin
OB History

Gravidity:    1         Term:   0        Prem:   0        SAB:   0
TOP:          0       Ectopic:  0        Living: 0
Fetal Evaluation

Num Of Fetuses:     1
Fetal Heart         144
Rate(bpm):
Cardiac Activity:   Observed
Presentation:       Cephalic
Placenta:           Posterior, above cervical os
P. Cord Insertion:  Visualized, central

Amniotic Fluid
AFI FV:      Subjectively within normal limits

AFI Sum(cm)     %Tile       Largest Pocket(cm)
9.58            18

RUQ(cm)       RLQ(cm)       LUQ(cm)        LLQ(cm)
3.5
Biophysical Evaluation

Amniotic F.V:   Within normal limits       F. Tone:        Observed
F. Movement:    Observed                   Score:          [DATE]
F. Breathing:   Observed
Biometry

BPD:      87.6  mm     G. Age:  35w 3d         54  %    CI:        78.02   %    70 - 86
FL/HC:      22.7   %    20.1 -
HC:      313.8  mm     G. Age:  35w 1d         15  %    HC/AC:      0.81        0.93 -
AC:      389.3  mm     G. Age:  N/A          > 97  %    FL/BPD:     81.3   %    71 - 87
FL:       71.2  mm     G. Age:  36w 4d         71  %    FL/AC:      18.3   %    20 - 24

Est. FW:    5635  gm      8 lb 9 oz   > 90  %
Gestational Age

LMP:           35w 3d        Date:  07/28/16                 EDD:   05/04/17
U/S Today:     35w 5d                                        EDD:   05/02/17
Best:          35w 3d     Det. By:  LMP  (07/28/16)          EDD:   05/04/17
Anatomy

Cranium:               Appears normal         Aortic Arch:            Previously seen
Cavum:                 Previously seen        Ductal Arch:            Previously seen
Ventricles:            Previously seen        Diaphragm:              Appears normal
Choroid Plexus:        Previously seen        Stomach:                Appears normal, left
sided
Cerebellum:            Previously seen        Abdomen:                Appears normal
Posterior Fossa:       Previously seen        Abdominal Wall:         Previously seen
Nuchal Fold:           Previously seen        Cord Vessels:           Previously seen
Face:                  Orbits and profile     Kidneys:                Previously seen
previously seen
Lips:                  Previously seen        Bladder:                Appears normal
Thoracic:              Previously seen        Spine:                  Previously seen
Heart:                 Appears normal         Upper Extremities:      Previously seen
(4CH, axis, and situs
RVOT:                  Previously seen        Lower Extremities:      Previously seen
LVOT:                  Previously seen

Other:  Fetus appears to be a female.
Impression

SIUP at 35+3 weeks
Cephalic presentation
Normal interval anatomy; anatomic survey complete
Low normal amniotic fluid volume
EFW > 90th %tile; AC > 97th %tile; 8+9; 5635 grams
BPP [DATE]
Recommendations

Continue antenatal testing
To [facility] for BP evaluation

## 2018-04-08 MED ORDER — CYCLOBENZAPRINE HCL 10 MG PO TABS: 10.0000 mg | ORAL_TABLET | Freq: Two times a day (BID) | ORAL | 0 refills | Status: DC | PRN

## 2018-04-08 MED ORDER — IBUPROFEN 600 MG PO TABS: 600.0000 mg | ORAL_TABLET | Freq: Four times a day (QID) | ORAL | 0 refills | Status: AC | PRN

## 2018-04-08 MED ORDER — ACETAMINOPHEN 325 MG PO TABS
650.0000 mg | ORAL_TABLET | Freq: Once | ORAL | Status: AC
  Administered 2018-04-08: 650 mg via ORAL
  Filled 2018-04-08: qty 2
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
## 2018-04-08 NOTE — ED Notes (Signed)
Bed: WA06 Expected date:  Expected time:  Means of arrival:  Comments: 
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
## 2018-04-08 NOTE — ED Notes (Signed)
Patient given discharge teaching and verbalized understanding. Patient ambulated out of ED with a steady gait. 

## 2018-11-08 ENCOUNTER — Other Ambulatory Visit: Payer: Self-pay

## 2018-11-08 ENCOUNTER — Emergency Department (HOSPITAL_COMMUNITY)
Admission: EM | Admit: 2018-11-08 | Discharge: 2018-11-08 | Disposition: A | Discharge status: Home / Self Care | Payer: Commercial Managed Care - PPO | Attending: Emergency Medicine | Admitting: Emergency Medicine

## 2018-11-08 ENCOUNTER — Encounter (HOSPITAL_COMMUNITY): Payer: Self-pay

## 2018-11-08 DIAGNOSIS — I10 Essential (primary) hypertension: Secondary | ICD-10-CM | POA: Insufficient documentation

## 2018-11-08 DIAGNOSIS — Z79899 Other long term (current) drug therapy: Secondary | ICD-10-CM | POA: Insufficient documentation

## 2018-11-08 DIAGNOSIS — R739 Hyperglycemia, unspecified: Secondary | ICD-10-CM

## 2018-11-08 DIAGNOSIS — R51 Headache: Secondary | ICD-10-CM | POA: Diagnosis not present

## 2018-11-08 DIAGNOSIS — R21 Rash and other nonspecific skin eruption: Secondary | ICD-10-CM | POA: Diagnosis not present

## 2018-11-08 DIAGNOSIS — H029 Unspecified disorder of eyelid: Secondary | ICD-10-CM | POA: Diagnosis not present

## 2018-11-08 DIAGNOSIS — R519 Headache, unspecified: Secondary | ICD-10-CM

## 2018-11-08 DIAGNOSIS — Z7984 Long term (current) use of oral hypoglycemic drugs: Secondary | ICD-10-CM | POA: Insufficient documentation

## 2018-11-08 DIAGNOSIS — E876 Hypokalemia: Secondary | ICD-10-CM | POA: Insufficient documentation

## 2018-11-08 DIAGNOSIS — E1165 Type 2 diabetes mellitus with hyperglycemia: Secondary | ICD-10-CM | POA: Diagnosis not present

## 2018-11-08 DIAGNOSIS — Z91040 Latex allergy status: Secondary | ICD-10-CM | POA: Diagnosis not present

## 2018-11-08 LAB — BASIC METABOLIC PANEL
Anion gap: 10 (ref 5–15)
BUN: 8 mg/dL (ref 6–20)
CO2: 24 mmol/L (ref 22–32)
Calcium: 8.9 mg/dL (ref 8.9–10.3)
Chloride: 101 mmol/L (ref 98–111)
Creatinine, Ser: 0.68 mg/dL (ref 0.44–1.00)
GFR calc Af Amer: >60 mL/min (ref >60)
GFR calc non Af Amer: >60 mL/min (ref >60)
Glucose, Bld: 220 mg/dL — ABNORMAL HIGH (ref 70–99)
Potassium: 3.3 mmol/L — ABNORMAL LOW (ref 3.5–5.1)
Sodium: 135 mmol/L (ref 135–145)

## 2018-11-08 LAB — CBC WITH DIFFERENTIAL/PLATELET
Abs Immature Granulocytes: 0.03 10*3/uL (ref 0.00–0.07)
Basophils Absolute: 0.1 10*3/uL (ref 0.0–0.1)
Basophils Relative: 1 %
Eosinophils Absolute: 0.2 10*3/uL (ref 0.0–0.5)
Eosinophils Relative: 2 %
HCT: 35.7 % — ABNORMAL LOW (ref 36.0–46.0)
Hemoglobin: 12.5 g/dL (ref 12.0–15.0)
Immature Granulocytes: 0 %
Lymphocytes Relative: 31 %
Lymphs Abs: 2.1 10*3/uL (ref 0.7–4.0)
MCH: 28.2 pg (ref 26.0–34.0)
MCHC: 35 g/dL (ref 30.0–36.0)
MCV: 80.6 fL (ref 80.0–100.0)
Monocytes Absolute: 0.6 10*3/uL (ref 0.1–1.0)
Monocytes Relative: 8 %
Neutro Abs: 4 10*3/uL (ref 1.7–7.7)
Neutrophils Relative %: 58 %
Platelets: 345 10*3/uL (ref 150–400)
RBC: 4.43 MIL/uL (ref 3.87–5.11)
RDW: 13.9 % (ref 11.5–15.5)
WBC: 6.9 10*3/uL (ref 4.0–10.5)
nRBC: 0 % (ref 0.0–0.2)

## 2018-11-08 LAB — I-STAT BETA HCG BLOOD, ED (MC, WL, AP ONLY): I-stat hCG, quantitative: <5 m[IU]/mL (ref <5)

## 2018-11-08 MED ORDER — SODIUM CHLORIDE 0.9 % IV BOLUS
1000.0000 mL | Freq: Once | INTRAVENOUS | Status: AC
  Administered 2018-11-08: 1000 mL via INTRAVENOUS

## 2018-11-08 MED ORDER — ONDANSETRON 4 MG PO TBDP
4.0000 mg | ORAL_TABLET | Freq: Once | ORAL | Status: AC
  Administered 2018-11-08: 4 mg via ORAL
  Filled 2018-11-08: qty 1

## 2018-11-08 MED ORDER — POTASSIUM CHLORIDE CRYS ER 20 MEQ PO TBCR
40.0000 meq | EXTENDED_RELEASE_TABLET | Freq: Once | ORAL | Status: AC
  Administered 2018-11-08: 40 meq via ORAL
  Filled 2018-11-08: qty 2

## 2018-11-08 NOTE — Discharge Instructions (Signed)
Continue to hydrate at home. Monitor your blood sugar and take medication as prescribed. Follow up with your doctor.

## 2018-11-08 NOTE — ED Triage Notes (Signed)
Pt reports headache, dizziness and nausea for 1 week. Pt a.o, nad noted

## 2018-11-08 NOTE — ED Provider Notes (Signed)
[facility] Provider Note   CSN: [id number] Arrival date & time: 11/08/18  1007    History   Chief Complaint Chief Complaint  Patient presents with  . Headache  . Dizziness  . Nausea    HPI Meredith Harrington is a 33 y.o. female.     33yo female with complaint of headaches (pressure in the back of the head), feels lightheaded/weak with standing quickly. Symptoms started 1 week ago, no prior similar symptoms. No recent falls, injuries, vomiting or diarrhea, fevers, chills, bodyaches. Reports nausea with her symptoms. Also notes a blister to her right eye, concerned for HSV, has had previous cold sores, last one was 2 weeks ago on the lip. Patient is breast feeding, no other complaints or concerns.      Past Medical History:  Diagnosis Date  . Diabetes mellitus without complication (HCC)   . Hypertension 05/2016  . Vaginal Pap smear, abnormal     Patient Active Problem List   Diagnosis Date Noted  . Postpartum care and examination 05/06/2017  . 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 Hereditary disease in family possibly affecting fetus, fetus 1   . Low vitamin D level 10/28/2016  . 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 Chronic hypertension during pregnancy, antepartum 10/21/2016  . S/P cholecystectomy 10/21/2016  . DM (diabetes mellitus) (HCC) 10/21/2016  . Medical non-compliance 10/21/2016    Past Surgical History:  Procedure Laterality Date  . CESAREAN SECTION    . CHOLECYSTECTOMY       OB History    Gravida  1   Para  0   Term  0   Preterm  0   AB  0   Living  0     SAB  0   TAB  0   Ectopic  0   Multiple  0   Live Births               Home Medications    Prior to Admission medications   Medication Sig Start Date End Date Taking? Authorizing Provider  acidophilus (RISAQUAD) CAPS capsule Take 1 capsule by mouth daily.   Yes [provider]  Azilsartan-Chlorthalidone (EDARBYCLOR) 40-12.5  MG TABS Take 1 tablet by mouth daily.   Yes [provider]  Dulaglutide (TRULICITY) 1.5 MG/0.5ML SOPN Inject 1.5 mg into the skin once a week. Monday   Yes [provider]  metFORMIN (GLUCOPHAGE) 500 MG tablet Take 500 mg by mouth as needed. 11/03/18  Yes [provider]  OVER THE COUNTER MEDICATION Take 1 tablet by mouth daily. cinnamon and chromium picolinate for diabetes sea moss and bladderwrack for diabetes   Yes [provider]  zinc sulfate (ZINC-220) 220 (50 Zn) MG capsule Take 220 mg by mouth daily.   Yes [provider]  ACCU-CHEK FASTCLIX LANCETS MISC 1 Device by Does not apply route 4 (four) times daily. 11/04/16   Orvilla Cornwallenney, Rachelle A, CNM  amLODipine (NORVASC) 10 MG tablet Take 1 tablet (10 mg total) by mouth daily. Patient not taking: Reported on 04/08/2018 05/06/17   Hermina StaggersErvin, Michael L, MD  cyclobenzaprine (FLEXERIL) 10 MG tablet Take 1 tablet (10 mg total) by mouth 2 (two) times daily as needed for muscle spasms. Patient not taking: Reported on 11/08/2018 04/08/18   Fayrene Helperran, Bowie, PA-C  glucose blood (ACCU-CHEK GUIDE) test strip Check glucose level fasting am and 2 hours after meals 11/04/16   Denney, Rachelle A, CNM  ibuprofen (ADVIL,MOTRIN) 600 MG tablet Take  1 tablet (600 mg total) by mouth every 6 (six) hours as needed. Patient not taking: Reported on 11/08/2018 04/08/18   Domenic Moras, PA-C  UNKNOWN TO PATIENT Take 1 tablet by mouth daily.    [provider]    Family History Family History  Problem Relation Age of Onset  . Hypertension Mother 6  . Diabetes Mother 41  . Stroke Mother 81  . Hypertension Father   . Diabetes Father     Social History Social History   Tobacco Use  . Smoking status: Never Smoker  . Smokeless tobacco: Never Used  Substance Use Topics  . Alcohol use: Yes    Comment: ocasional on holidays  . Drug use: Yes    Types: Marijuana    Comment: Not since pregnancy     Allergies   Shellfish  allergy and Latex   Review of Systems Review of Systems  Constitutional: Negative for chills, diaphoresis and fever.  HENT: Positive for sinus pressure. Negative for congestion and sore throat.   Eyes: Negative for pain and visual disturbance.  Respiratory: Negative for cough and shortness of breath.   Cardiovascular: Negative for chest pain.  Gastrointestinal: Positive for nausea. Negative for abdominal pain, constipation, diarrhea and vomiting.  Genitourinary: Negative for dysuria, frequency, genital sores and urgency.  Musculoskeletal: Negative for arthralgias and myalgias.  Skin: Positive for rash. Negative for wound.  Allergic/Immunologic: Negative for immunocompromised state.  Neurological: Positive for light-headedness and headaches. Negative for dizziness and weakness.  Psychiatric/Behavioral: Negative for confusion.  All other systems reviewed and are negative.    Physical Exam Updated Vital Signs BP 104/69   Pulse 78   Temp 98.3 F (36.8 C) (Oral)   Resp 17   SpO2 100%   Physical Exam Vitals signs and nursing note reviewed.  Constitutional:      General: She is not in acute distress.    Appearance: She is well-developed. She is not diaphoretic.    HENT:     Head: Normocephalic and atraumatic.     Mouth/Throat:     Mouth: Mucous membranes are moist.  Eyes:     Extraocular Movements: Extraocular movements intact.     Pupils: Pupils are equal, round, and reactive to light.   Neck:     Musculoskeletal: Neck supple.  Cardiovascular:     Rate and Rhythm: Normal rate and regular rhythm.     Heart sounds: Normal heart sounds. No murmur.  Pulmonary:     Effort: Pulmonary effort is normal.     Breath sounds: Normal breath sounds.  Lymphadenopathy:     Cervical: No cervical adenopathy.  Neurological:     Mental Status: She is alert and oriented to person, place, and time.     GCS: GCS eye subscore is 4. GCS verbal subscore is 5. GCS motor subscore is 6.      Cranial Nerves: No cranial nerve deficit or facial asymmetry.     Sensory: No sensory deficit.     Motor: No weakness.  Psychiatric:        Behavior: Behavior normal.      ED Treatments / Results  Labs (all labs ordered are listed, but only abnormal results are displayed) Labs Reviewed  BASIC METABOLIC PANEL - Abnormal; Notable for the following components:      Result Value   Potassium 3.3 (*)    Glucose, Bld 220 (*)    All other components within normal limits  CBC WITH DIFFERENTIAL/PLATELET - Abnormal; Notable for the  following components:   HCT 35.7 (*)    All other components within normal limits  I-STAT BETA HCG BLOOD, ED ([facility], [facility], [facility] ONLY)    EKG None  Radiology No results found.  Procedures Procedures (including critical care time)  Medications Ordered in ED Medications  ondansetron (ZOFRAN-ODT) disintegrating tablet 4 mg (4 mg Oral Given 11/08/18 1117)  sodium chloride 0.9 % bolus 1,000 mL (0 mLs Intravenous Stopped 11/08/18 1231)  potassium chloride SA (K-DUR) CR tablet 40 mEq (40 mEq Oral Given 11/08/18 1229)     Initial Impression / Assessment and Plan / ED Course  I have reviewed the triage vital signs and the nursing notes.  Pertinent labs & imaging results that were available during my care of the patient were reviewed by me and considered in my medical decision making (see chart for details).  Clinical Course as of Nov 07 1317  Mon Nov 08, 2018  [id number] 33 year old female presents with complaint of headache described as a pressure sensation in the back of her head which occurs when she transitions from sitting to standing.  Also reports a rash to her right upper eyelid for the past week, concern for HSV.  Neuro exam is unremarkable.  Orthostatic vitals assessed after IV fluids were given and are normal.  Patient is found to be hyperglycemic with a blood sugar of 220 and potassium of 3.3.  Would expect patient's blood sugar to responded to IV hydration, given  p.o. potassium while in the ER.  Patient CBC unremarkable, hCG negative.  Patient is feeling better, ready for dc.   [LM]    Clinical Course User Index [LM] Jeannie FendMurphy, Jamilah Jean A, PA-C      Final Clinical Impressions(s) / ED Diagnoses   Final diagnoses:  Acute nonintractable headache, unspecified headache type  Hypokalemia  Rash  Hyperglycemia    ED Discharge Orders    None       Jeannie FendMurphy, Aneisha Skyles A, PA-C 11/08/18 1320    Jacalyn LefevreHaviland, Julie, MD 11/13/18 [phone]

## 2019-03-09 ENCOUNTER — Encounter (HOSPITAL_COMMUNITY): Payer: Self-pay

## 2019-03-09 ENCOUNTER — Ambulatory Visit (HOSPITAL_COMMUNITY)
Admission: EM | Admit: 2019-03-09 | Discharge: 2019-03-09 | Disposition: A | Discharge status: Home / Self Care | Payer: Commercial Managed Care - PPO | Attending: Family Medicine | Admitting: Family Medicine

## 2019-03-09 ENCOUNTER — Other Ambulatory Visit: Payer: Self-pay

## 2019-03-09 DIAGNOSIS — M25511 Pain in right shoulder: Secondary | ICD-10-CM

## 2019-03-09 DIAGNOSIS — S39012A Strain of muscle, fascia and tendon of lower back, initial encounter: Secondary | ICD-10-CM

## 2019-03-09 MED ORDER — CYCLOBENZAPRINE HCL 10 MG PO TABS: ORAL_TABLET | ORAL | 0 refills | Status: AC

## 2019-03-09 MED ORDER — DICLOFENAC SODIUM 75 MG PO TBEC: 75.0000 mg | DELAYED_RELEASE_TABLET | Freq: Two times a day (BID) | ORAL | 0 refills | Status: AC

## 2019-03-09 NOTE — Discharge Instructions (Signed)
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
## 2019-03-14 ENCOUNTER — Other Ambulatory Visit: Payer: Self-pay | Admitting: Physician Assistant

## 2019-03-14 ENCOUNTER — Ambulatory Visit: Payer: Self-pay

## 2019-03-14 ENCOUNTER — Other Ambulatory Visit: Payer: Self-pay

## 2019-03-14 DIAGNOSIS — M545 Low back pain, unspecified: Secondary | ICD-10-CM

## 2019-03-14 DIAGNOSIS — M25571 Pain in right ankle and joints of right foot: Secondary | ICD-10-CM

## 2019-03-14 DIAGNOSIS — M25511 Pain in right shoulder: Secondary | ICD-10-CM

## 2020-02-07 ENCOUNTER — Other Ambulatory Visit: Payer: Self-pay

## 2020-02-07 ENCOUNTER — Emergency Department (HOSPITAL_COMMUNITY)
Admission: EM | Admit: 2020-02-07 | Discharge: 2020-02-07 | Disposition: A | Discharge status: Home / Self Care | Payer: Worker's Compensation | Attending: Emergency Medicine | Admitting: Emergency Medicine

## 2020-02-07 ENCOUNTER — Encounter (HOSPITAL_COMMUNITY): Payer: Self-pay | Admitting: Emergency Medicine

## 2020-02-07 DIAGNOSIS — E119 Type 2 diabetes mellitus without complications: Secondary | ICD-10-CM | POA: Insufficient documentation

## 2020-02-07 DIAGNOSIS — I10 Essential (primary) hypertension: Secondary | ICD-10-CM | POA: Insufficient documentation

## 2020-02-07 DIAGNOSIS — Z91040 Latex allergy status: Secondary | ICD-10-CM | POA: Insufficient documentation

## 2020-02-07 DIAGNOSIS — Y990 Civilian activity done for income or pay: Secondary | ICD-10-CM | POA: Insufficient documentation

## 2020-02-07 DIAGNOSIS — S161XXA Strain of muscle, fascia and tendon at neck level, initial encounter: Secondary | ICD-10-CM | POA: Diagnosis not present

## 2020-02-07 DIAGNOSIS — S199XXA Unspecified injury of neck, initial encounter: Secondary | ICD-10-CM | POA: Diagnosis present

## 2020-02-07 DIAGNOSIS — Z79899 Other long term (current) drug therapy: Secondary | ICD-10-CM | POA: Insufficient documentation

## 2020-02-07 DIAGNOSIS — R251 Tremor, unspecified: Secondary | ICD-10-CM | POA: Diagnosis not present

## 2020-02-07 DIAGNOSIS — R519 Headache, unspecified: Secondary | ICD-10-CM | POA: Diagnosis not present

## 2020-02-07 DIAGNOSIS — Y93I9 Activity, other involving external motion: Secondary | ICD-10-CM | POA: Diagnosis not present

## 2020-02-07 DIAGNOSIS — R110 Nausea: Secondary | ICD-10-CM | POA: Insufficient documentation

## 2020-02-07 DIAGNOSIS — Y92410 Unspecified street and highway as the place of occurrence of the external cause: Secondary | ICD-10-CM | POA: Insufficient documentation

## 2020-02-07 DIAGNOSIS — Z7984 Long term (current) use of oral hypoglycemic drugs: Secondary | ICD-10-CM | POA: Insufficient documentation

## 2020-02-07 LAB — BASIC METABOLIC PANEL
Anion gap: 9 (ref 5–15)
BUN: 9 mg/dL (ref 6–20)
CO2: 25 mmol/L (ref 22–32)
Calcium: 9.4 mg/dL (ref 8.9–10.3)
Chloride: 104 mmol/L (ref 98–111)
Creatinine, Ser: 0.74 mg/dL (ref 0.44–1.00)
GFR, Estimated: >60 mL/min (ref >60)
Glucose, Bld: 132 mg/dL — ABNORMAL HIGH (ref 70–99)
Potassium: 4.1 mmol/L (ref 3.5–5.1)
Sodium: 138 mmol/L (ref 135–145)

## 2020-02-07 LAB — CBC
HCT: 37.2 % (ref 36.0–46.0)
Hemoglobin: 12.7 g/dL (ref 12.0–15.0)
MCH: 27.1 pg (ref 26.0–34.0)
MCHC: 34.1 g/dL (ref 30.0–36.0)
MCV: 79.5 fL — ABNORMAL LOW (ref 80.0–100.0)
Platelets: 342 10*3/uL (ref 150–400)
RBC: 4.68 MIL/uL (ref 3.87–5.11)
RDW: 14.6 % (ref 11.5–15.5)
WBC: 11 10*3/uL — ABNORMAL HIGH (ref 4.0–10.5)
nRBC: 0 % (ref 0.0–0.2)

## 2020-02-07 MED ORDER — OXYCODONE-ACETAMINOPHEN 5-325 MG PO TABS: 1.0000 | ORAL_TABLET | Freq: Once | ORAL | Status: DC

## 2020-02-07 MED ORDER — METHOCARBAMOL 500 MG PO TABS: 500.0000 mg | ORAL_TABLET | Freq: Two times a day (BID) | ORAL | 0 refills | Status: AC

## 2020-02-07 MED ORDER — ACETAMINOPHEN 325 MG PO TABS: 650.0000 mg | ORAL_TABLET | Freq: Once | ORAL | Status: DC

## 2020-02-07 NOTE — ED Notes (Signed)
Patient verbalizes understanding of discharge instructions. Opportunity for questioning and answers were provided. Armband removed by staff, pt discharged from ED ambulatory to home.  
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
